# Patient Record
Sex: Male | Born: 1980 | Race: White | Hispanic: No | Marital: Single | Smoking: Current every day smoker
Health system: Southern US, Community
[De-identification: ages and names within clinical notes are randomized; demographics above are authoritative.]

## PROBLEM LIST (undated history)

## (undated) DIAGNOSIS — T7840XA Allergy, unspecified, initial encounter: Secondary | ICD-10-CM

## (undated) HISTORY — DX: Allergy, unspecified, initial encounter: T78.40XA

---

## 2006-08-23 ENCOUNTER — Emergency Department (HOSPITAL_COMMUNITY): Admission: EM | Admit: 2006-08-23 | Discharge: 2006-08-23 | Payer: Self-pay | Admitting: Emergency Medicine

## 2008-02-12 ENCOUNTER — Emergency Department (HOSPITAL_COMMUNITY): Admission: EM | Admit: 2008-02-12 | Discharge: 2008-02-12 | Payer: Self-pay | Admitting: Emergency Medicine

## 2013-11-22 ENCOUNTER — Encounter (HOSPITAL_BASED_OUTPATIENT_CLINIC_OR_DEPARTMENT_OTHER): Payer: Self-pay | Admitting: Emergency Medicine

## 2013-11-22 ENCOUNTER — Emergency Department (HOSPITAL_BASED_OUTPATIENT_CLINIC_OR_DEPARTMENT_OTHER)
Admission: EM | Admit: 2013-11-22 | Discharge: 2013-11-22 | Disposition: A | Discharge status: Home / Self Care | Payer: BC Managed Care – PPO | Attending: Emergency Medicine | Admitting: Emergency Medicine

## 2013-11-22 DIAGNOSIS — A64 Unspecified sexually transmitted disease: Secondary | ICD-10-CM | POA: Insufficient documentation

## 2013-11-22 DIAGNOSIS — F17200 Nicotine dependence, unspecified, uncomplicated: Secondary | ICD-10-CM | POA: Insufficient documentation

## 2013-11-22 LAB — URINALYSIS, ROUTINE W REFLEX MICROSCOPIC
Bilirubin Urine: NEGATIVE
GLUCOSE, UA: NEGATIVE mg/dL
Hgb urine dipstick: NEGATIVE
Ketones, ur: NEGATIVE mg/dL
LEUKOCYTES UA: NEGATIVE
NITRITE: NEGATIVE
PROTEIN: NEGATIVE mg/dL
Specific Gravity, Urine: 1.015 (ref 1.005–1.030)
Urobilinogen, UA: 0.2 mg/dL (ref 0.0–1.0)
pH: 5.5 (ref 5.0–8.0)

## 2013-11-22 MED ORDER — CEFTRIAXONE SODIUM 250 MG IJ SOLR
250.0000 mg | Freq: Once | INTRAMUSCULAR | Status: AC
  Administered 2013-11-22: 250 mg via INTRAMUSCULAR
  Filled 2013-11-22: qty 250

## 2013-11-22 MED ORDER — DOXYCYCLINE HYCLATE 100 MG PO CAPS: 100.0000 mg | ORAL_CAPSULE | Freq: Two times a day (BID) | ORAL | Status: DC

## 2013-11-22 NOTE — ED Notes (Signed)
Dysuria off and on for a week.

## 2013-11-22 NOTE — ED Provider Notes (Signed)
CSN: [id number]     Arrival date & time 11/22/13  1755 History  This chart was scribed for Toy Baker, MD by Blanchard Kelch, ED Scribe. The patient was seen in room MH04/MH04. Patient's care was started at 6:07 PM.    Chief Complaint  Patient presents with  . Dysuria     Patient is a 33 y.o. male presenting with dysuria. The history is provided by the patient. No language interpreter was used.  Dysuria    HPI Comments: Todd Montgomery is a 33 y.o. male who presents to the Emergency Department complaining of constant, moderate dysuria that began a week ago. He reports having a similar symptom in the past that was due to Gonorrhea infection years ago. He denies penile discharge, fever, back pain or genital rash. Symptoms have been persistent and nothing makes them better worse. No treatment used prior to arrival. Last sexual encounter was a week ago  History reviewed. No pertinent past medical history. History reviewed. No pertinent past surgical history. No family history on file. History  Substance Use Topics  . Smoking status: Current Every Day Smoker -- 0.50 packs/day    Types: Cigarettes  . Smokeless tobacco: Not on file  . Alcohol Use: Yes    Review of Systems  Constitutional: Negative for fever.  Genitourinary: Positive for dysuria. Negative for discharge.  Musculoskeletal: Negative for back pain.  Skin: Negative for rash.  All other systems reviewed and are negative.     Allergies  Review of patient's allergies indicates no known allergies.  Home Medications   Prior to Admission medications   Not on File   Triage Vitals: BP 127/84  Pulse 84  Temp(Src) 97.9 F (36.6 C) (Oral)  Resp 16  Ht 5\' 8"  (1.727 m)  Wt 165 lb (74.844 kg)  BMI 25.09 kg/m2  SpO2 100%  Physical Exam  Nursing note and vitals reviewed. Constitutional: He is oriented to person, place, and time. He appears well-developed and well-nourished.  Non-toxic appearance.  HENT:  Head:  Normocephalic and atraumatic.  Eyes: Conjunctivae are normal. Pupils are equal, round, and reactive to light.  Neck: Normal range of motion.  Cardiovascular: Normal rate.   Pulmonary/Chest: Effort normal.  Genitourinary: Testes normal and penis normal. Circumcised.  Neurological: He is alert and oriented to person, place, and time.  Skin: Skin is warm and dry.  Psychiatric: He has a normal mood and affect.    ED Course  Procedures (including critical care time)  DIAGNOSTIC STUDIES: Oxygen Saturation is 100% on room air, normal by my interpretation.    COORDINATION OF CARE: 6:11 PM -Will order GC/Chlamydia probe and UA. Patient verbalizes understanding and agrees with treatment plan.    Labs Review Labs Reviewed  URINALYSIS, ROUTINE W REFLEX MICROSCOPIC    Imaging Review No results found.   EKG Interpretation None      MDM   Final diagnoses:  None    I personally performed the services described in this documentation, which was scribed in my presence. The recorded information has been reviewed and is accurate.  Patient given Rocephin IM and will be placed on doxycycline.   Toy Baker, MD 11/22/13 [phone]

## 2013-11-22 NOTE — Discharge Instructions (Signed)
Sexually Transmitted Disease A sexually transmitted disease (STD) is a disease or infection that may be passed (transmitted) from person to person, usually during sexual activity. This may happen by way of saliva, semen, blood, vaginal mucus, or urine. Common STDs include:   Gonorrhea.   Chlamydia.   Syphilis.   HIV and AIDS.   Genital herpes.   Hepatitis B and C.   Trichomonas.   Human papillomavirus (HPV).   Pubic lice.   Scabies.  Mites.  Bacterial vaginosis. WHAT ARE CAUSES OF STDs? An STD may be caused by bacteria, a virus, or parasites. STDs are often transmitted during sexual activity if one person is infected. However, they may also be transmitted through nonsexual means. STDs may be transmitted after:   Sexual intercourse with an infected person.   Sharing sex toys with an infected person.   Sharing needles with an infected person or using unclean piercing or tattoo needles.  Having intimate contact with the genitals, mouth, or rectal areas of an infected person.   Exposure to infected fluids during birth. WHAT ARE THE SIGNS AND SYMPTOMS OF STDs? Different STDs have different symptoms. Some people may not have any symptoms. If symptoms are present, they may include:   Painful or bloody urination.   Pain in the pelvis, abdomen, vagina, anus, throat, or eyes.   Skin rash, itching, irritation, growths, sores (lesions), ulcerations, or warts in the genital or anal area.  Abnormal vaginal discharge with or without bad odor.   Penile discharge in men.   Fever.   Pain or bleeding during sexual intercourse.   Swollen glands in the groin area.   Yellow skin and eyes (jaundice). This is seen with hepatitis.   Swollen testicles.  Infertility.  Sores and blisters in the mouth. HOW ARE STDs DIAGNOSED? To make a diagnosis, your health care provider may:   Take a medical history.   Perform a physical exam.   Take a sample of any  discharge for examination.  Swab the throat, cervix, opening to the penis, rectum, or vagina for testing.  Test a sample of your first morning urine.   Perform blood tests.   Perform a Pap smear, if this applies.   Perform a colposcopy.   Perform a laparoscopy.  HOW ARE STDs TREATED? Treatment depends on the STD. Some STDs may be treated but not cured.   Chlamydia, gonorrhea, trichomonas, and syphilis can be cured with antibiotics.   Genital herpes, hepatitis, and HIV can be treated, but not cured, with prescribed medicines. The medicines lessen symptoms.   Genital warts from HPV can be treated with medicine or by freezing, burning (electrocautery), or surgery. Warts may come back.   HPV cannot be cured with medicine or surgery. However, abnormal areas may be removed from the cervix, vagina, or vulva.   If your diagnosis is confirmed, your recent sexual partners need treatment. This is true even if they are symptom-free or have a negative culture or evaluation. They should not have sex until their health care providers say it is OK. HOW CAN I REDUCE MY RISK OF GETTING AN STD?  Use latex condoms, dental dams, and water-soluble lubricants during sexual activity. Do not use petroleum jelly or oils.  Get vaccinated for HPV and hepatitis. If you have not received these vaccines in the past, talk to your health care provider about whether one or both might be right for you.   Avoid risky sex practices that can break the skin.  WHAT SHOULD   I DO IF I THINK I HAVE AN STD?  See your health care provider.   Inform all sexual partners. They should be tested and treated for any STDs.  Do not have sex until your health care provider says it is OK. WHEN SHOULD I GET HELP? Seek immediate medical care if:  You develop severe abdominal pain.  You are a man and notice swelling or pain in the testicles.  You are a woman and notice swelling or pain in your vagina. Document  Released: 08/27/2002 Document Revised: 03/27/2013 Document Reviewed: 12/25/2012 [employer] Patient Information 2014 [employer].  

## 2013-11-23 LAB — HIV ANTIBODY (ROUTINE TESTING W REFLEX): HIV: NONREACTIVE

## 2013-11-23 LAB — RPR

## 2013-11-25 LAB — GC/CHLAMYDIA PROBE AMP
CT Probe RNA: NEGATIVE
GC Probe RNA: NEGATIVE

## 2016-02-10 ENCOUNTER — Encounter (HOSPITAL_COMMUNITY): Payer: Self-pay | Admitting: Emergency Medicine

## 2016-02-10 ENCOUNTER — Emergency Department (HOSPITAL_COMMUNITY)
Admission: EM | Admit: 2016-02-10 | Discharge: 2016-02-10 | Disposition: A | Discharge status: Home / Self Care | Payer: Self-pay | Attending: Emergency Medicine | Admitting: Emergency Medicine

## 2016-02-10 DIAGNOSIS — R300 Dysuria: Secondary | ICD-10-CM | POA: Insufficient documentation

## 2016-02-10 DIAGNOSIS — M545 Low back pain: Secondary | ICD-10-CM | POA: Insufficient documentation

## 2016-02-10 DIAGNOSIS — F17210 Nicotine dependence, cigarettes, uncomplicated: Secondary | ICD-10-CM | POA: Insufficient documentation

## 2016-02-10 DIAGNOSIS — R369 Urethral discharge, unspecified: Secondary | ICD-10-CM | POA: Insufficient documentation

## 2016-02-10 LAB — CBC WITH DIFFERENTIAL/PLATELET
Basophils Absolute: 0 10*3/uL (ref 0.0–0.1)
Basophils Relative: 0 %
Eosinophils Absolute: 0.2 10*3/uL (ref 0.0–0.7)
Eosinophils Relative: 3 %
HEMATOCRIT: 47.4 % (ref 39.0–52.0)
Hemoglobin: 16.3 g/dL (ref 13.0–17.0)
LYMPHS ABS: 2.4 10*3/uL (ref 0.7–4.0)
LYMPHS PCT: 43 %
MCH: 32.1 pg (ref 26.0–34.0)
MCHC: 34.4 g/dL (ref 30.0–36.0)
MCV: 93.3 fL (ref 78.0–100.0)
MONO ABS: 0.6 10*3/uL (ref 0.1–1.0)
Monocytes Relative: 11 %
Neutro Abs: 2.4 10*3/uL (ref 1.7–7.7)
Neutrophils Relative %: 43 %
Platelets: 217 10*3/uL (ref 150–400)
RBC: 5.08 MIL/uL (ref 4.22–5.81)
RDW: 11.6 % (ref 11.5–15.5)
WBC: 5.5 10*3/uL (ref 4.0–10.5)

## 2016-02-10 LAB — URINALYSIS, ROUTINE W REFLEX MICROSCOPIC
Bilirubin Urine: NEGATIVE
GLUCOSE, UA: NEGATIVE mg/dL
Hgb urine dipstick: NEGATIVE
Ketones, ur: NEGATIVE mg/dL
Nitrite: NEGATIVE
PROTEIN: NEGATIVE mg/dL
SPECIFIC GRAVITY, URINE: 1.012 (ref 1.005–1.030)
pH: 6 (ref 5.0–8.0)

## 2016-02-10 LAB — COMPREHENSIVE METABOLIC PANEL
ALT: 25 U/L (ref 17–63)
AST: 24 U/L (ref 15–41)
Albumin: 4.3 g/dL (ref 3.5–5.0)
Alkaline Phosphatase: 56 U/L (ref 38–126)
Anion gap: 8 (ref 5–15)
BUN: 12 mg/dL (ref 6–20)
CO2: 28 mmol/L (ref 22–32)
Calcium: 9.5 mg/dL (ref 8.9–10.3)
Chloride: 102 mmol/L (ref 101–111)
Creatinine, Ser: 1.27 mg/dL — ABNORMAL HIGH (ref 0.61–1.24)
GFR calc non Af Amer: >60 mL/min (ref >60)
Glucose, Bld: 79 mg/dL (ref 65–99)
Potassium: 3.7 mmol/L (ref 3.5–5.1)
Sodium: 138 mmol/L (ref 135–145)
Total Bilirubin: 0.9 mg/dL (ref 0.3–1.2)
Total Protein: 7.4 g/dL (ref 6.5–8.1)

## 2016-02-10 LAB — URINE MICROSCOPIC-ADD ON

## 2016-02-10 LAB — POC OCCULT BLOOD, ED: Fecal Occult Bld: NEGATIVE

## 2016-02-10 MED ORDER — NAPROXEN 500 MG PO TABS: 500.0000 mg | ORAL_TABLET | Freq: Two times a day (BID) | ORAL | 0 refills | Status: DC

## 2016-02-10 MED ORDER — CEFTRIAXONE SODIUM 250 MG IJ SOLR
250.0000 mg | Freq: Once | INTRAMUSCULAR | Status: AC
  Administered 2016-02-10: 250 mg via INTRAMUSCULAR
  Filled 2016-02-10: qty 250

## 2016-02-10 MED ORDER — LIDOCAINE 5 % EX PTCH: 1.0000 | MEDICATED_PATCH | CUTANEOUS | 0 refills | Status: DC

## 2016-02-10 MED ORDER — AZITHROMYCIN 250 MG PO TABS
1000.0000 mg | ORAL_TABLET | Freq: Every day | ORAL | Status: DC
Start: 2016-02-11 — End: 2016-02-11
  Administered 2016-02-10: 1000 mg via ORAL
  Filled 2016-02-10: qty 4

## 2016-02-10 MED ORDER — LIDOCAINE HCL (PF) 1 % IJ SOLN
2.0000 mL | Freq: Once | INTRAMUSCULAR | Status: AC
  Administered 2016-02-10: 2 mL via INTRADERMAL
  Filled 2016-02-10: qty 5

## 2016-02-10 NOTE — ED Provider Notes (Signed)
[facility]-EMERGENCY DEPT Provider Note   CSN: [id number] Arrival date & time: 02/10/16  2044  By signing my name below, I, Todd Montgomery, attest that this documentation has been prepared under the direction and in the presence of Cheri FowlerKayla Zeynab Klett, PA-C. Electronically Signed: Gillis EndsJasmyn B. Lyn Montgomery, ED Scribe. 02/10/16. 9:12 PM.  History   Chief Complaint Chief Complaint  Patient presents with  . Back Pain    HPI HPI Comments: Todd Montgomery is a 35 y.o. male who presents to the Emergency Department complaining of sudden onset, intermittent, sharp, radiating left-side lower back pain to left buttock x 2 weeks. He states that pain suddenly began after standing up from feeding his dogs. Pt has associated nausea, dysuria, and 2 episodes of "burgundy" BMs.  Last normal BM, Monday. He has taken Ibuprofen for pain ([phone] mg) with mild relief. Pain is exacerbated with movement. Denies any recent diet changes. Denies any weakness, numbness, bowel/urinary incontinence, abdominal pain, fever, chills, or vomiting.  Patient told male EMT that he was having penile discharge/burning. Endorses new sexual partners.   The history is provided by the patient. No language interpreter was used.    History reviewed. No pertinent past medical history.  There are no active problems to display for this patient.  History reviewed. No pertinent surgical history.  Home Medications    Prior to Admission medications   Medication Sig Start Date End Date Taking? Authorizing Provider  doxycycline (VIBRAMYCIN) 100 MG capsule Take 1 capsule (100 mg total) by mouth 2 (two) times daily. 11/22/13   Lorre NickAnthony Allen, MD  lidocaine (LIDODERM) 5 % Place 1 patch onto the skin daily. Remove & Discard patch within 12 hours or as directed by MD 02/10/16   Cheri FowlerKayla Taytum Scheck, PA-C  naproxen (NAPROSYN) 500 MG tablet Take 1 tablet (500 mg total) by mouth 2 (two) times daily. 02/10/16   Cheri FowlerKayla Ashima Shrake, PA-C    Family History No family history on  file.  Social History Social History  Substance Use Topics  . Smoking status: Current Every Day Smoker    Packs/day: 0.50    Types: Cigarettes  . Smokeless tobacco: Not on file  . Alcohol use Yes     Allergies   Review of patient's allergies indicates no known allergies.   Review of Systems Review of Systems  Constitutional: Negative for chills and fever.  Gastrointestinal: Positive for blood in stool and nausea. Negative for abdominal pain and vomiting.  Genitourinary: Positive for dysuria.  Musculoskeletal: Positive for back pain.  Neurological: Negative for weakness and numbness.  All other systems reviewed and are negative.  Physical Exam Updated Vital Signs BP 129/96 (BP Location: Left Arm)   Pulse 88   Temp 98.2 F (36.8 C) (Oral)   Resp 19   Ht 5\' 8"  (1.727 m)   Wt 74.8 kg   SpO2 99%   BMI 25.09 kg/m   Physical Exam  Constitutional: He is oriented to person, place, and time. He appears well-developed and well-nourished.  HENT:  Head: Atraumatic.  Eyes: Conjunctivae are normal.  Cardiovascular: Normal rate, regular rhythm, normal heart sounds and intact distal pulses.   Pulses:      Dorsalis pedis pulses are 2+ on the right side, and 2+ on the left side.  Pulmonary/Chest: Effort normal and breath sounds normal.  Abdominal: Soft. Bowel sounds are normal. He exhibits no distension. There is no tenderness.  Genitourinary: Rectum normal, prostate normal, testes normal and penis normal. Rectal exam shows no external hemorrhoid, no internal  hemorrhoid and no tenderness. Right testis shows no swelling and no tenderness. Left testis shows no swelling and no tenderness. Circumcised.  Genitourinary Comments: Chaperone present during physical exam.  No gross blood on DRE.  Musculoskeletal: Normal range of motion. He exhibits no tenderness.  No spinous process tenderness.  No step offs. No crepitus.  Neurological: He is alert and oriented to person, place, and time.    No saddle anesthesia. Strength and sensation intact bilaterally throughout lower extremities. Normal gait.   Skin: Skin is warm and dry.  Psychiatric: He has a normal mood and affect. His behavior is normal.   ED Treatments / Results  DIAGNOSTIC STUDIES: Oxygen Saturation is 99% on RA, normal by my interpretation.    COORDINATION OF CARE: 9:10 PM-Discussed treatment plan which includes rectal exam, UA, CBC, and BMP with pt at bedside and pt agreed to plan.   Labs (all labs ordered are listed, but only abnormal results are displayed) Labs Reviewed  URINALYSIS, ROUTINE W REFLEX MICROSCOPIC (NOT AT [facility]) - Abnormal; Notable for the following:       Result Value   Leukocytes, UA SMALL (*)    All other components within normal limits  COMPREHENSIVE METABOLIC PANEL - Abnormal; Notable for the following:    Creatinine, Ser 1.27 (*)    All other components within normal limits  URINE MICROSCOPIC-ADD ON - Abnormal; Notable for the following:    Squamous Epithelial / LPF 0-5 (*)    Bacteria, UA RARE (*)    All other components within normal limits  CBC WITH DIFFERENTIAL/PLATELET  HIV ANTIBODY (ROUTINE TESTING)  RPR  POC OCCULT BLOOD, ED  GC/CHLAMYDIA PROBE AMP ([place]) NOT AT [facility]    Radiology No results found.  Procedures Procedures (including critical care time)  Medications Ordered in ED Medications  azithromycin (ZITHROMAX) tablet 1,000 mg (1,000 mg Oral Given 02/10/16 2320)  cefTRIAXone (ROCEPHIN) injection 250 mg (250 mg Intramuscular Given 02/10/16 2319)  lidocaine (PF) (XYLOCAINE) 1 % injection 2 mL (2 mLs Intradermal Given 02/10/16 2319)    Initial Impression / Assessment and Plan / ED Course  I have reviewed the triage vital signs and the nursing notes.  Pertinent labs & imaging results that were available during my care of the patient were reviewed by me and considered in my medical decision making (see chart for details).  Clinical Course   Patient presents  with low back pain.  VSS.  No injury/trauma.  No red flags.  No neurological deficits.  Distal pulses intact.  No gait abnormalities.  Suspect low back strain or other mechanical cause.  Doubt cauda equina.  Doubt infectious process.  Doubt AAA.  Patient also complained of 2 episodes of bloody stools, occult negative.  No gross blood on DRE.  Hgb stable.  Labs without acute abnormalities. Also, complaining of dysuria, penile discharge/burning.  GC/chlamydia, HIV, RPR pending. Treated empirically with Rocephin and Azithromycin.  Discussed return precautions to the ED.  Follow up with PCP.   Final Clinical Impressions(s) / ED Diagnoses   Final diagnoses:  Low back pain without sciatica, unspecified back pain laterality  Penile discharge  Dysuria    New Prescriptions New Prescriptions   LIDOCAINE (LIDODERM) 5 %    Place 1 patch onto the skin daily. Remove & Discard patch within 12 hours or as directed by MD   NAPROXEN (NAPROSYN) 500 MG TABLET    Take 1 tablet (500 mg total) by mouth 2 (two) times daily.   I personally  performed the services described in this documentation, which was scribed in my presence. The recorded information has been reviewed and is accurate.    Cheri FowlerKayla Marny Smethers, PA-C 02/10/16 2334    Charlynne Panderavid Hsienta Yao, MD 02/10/16 [phone]

## 2016-02-10 NOTE — ED Triage Notes (Signed)
Pt. reports left lower back pain for 1 1/2 weeks , pt. stated he stood up while feeding the dogs and felt sudden pain at left lower back , pain increases when turning to sides and getting up , denies urinary discomfort , ambulatory .

## 2016-02-10 NOTE — Discharge Instructions (Signed)
Your test results will be available in the next couple of days. If they are positive, you will receive a phone call. You have already been treated for STDs, so you do not need to return for treatment. Please inform all partners. Do not have sex for the next 7 days, until your symptoms resolve, and until all partners have been treated and tested.

## 2016-02-11 LAB — RPR: RPR: NONREACTIVE

## 2016-02-11 LAB — HIV ANTIBODY (ROUTINE TESTING W REFLEX): HIV Screen 4th Generation wRfx: NONREACTIVE

## 2016-02-11 LAB — GC/CHLAMYDIA PROBE AMP (~~LOC~~) NOT AT ARMC
Chlamydia: POSITIVE — AB
Neisseria Gonorrhea: NEGATIVE

## 2016-02-12 ENCOUNTER — Telehealth (HOSPITAL_BASED_OUTPATIENT_CLINIC_OR_DEPARTMENT_OTHER): Payer: Self-pay | Admitting: Emergency Medicine

## 2017-05-11 ENCOUNTER — Encounter (HOSPITAL_COMMUNITY): Payer: Self-pay | Admitting: Emergency Medicine

## 2017-05-11 ENCOUNTER — Emergency Department (HOSPITAL_COMMUNITY)
Admission: EM | Admit: 2017-05-11 | Discharge: 2017-05-11 | Disposition: A | Discharge status: Home / Self Care | Payer: Self-pay | Attending: Emergency Medicine | Admitting: Emergency Medicine

## 2017-05-11 DIAGNOSIS — N410 Acute prostatitis: Secondary | ICD-10-CM | POA: Insufficient documentation

## 2017-05-11 DIAGNOSIS — R1033 Periumbilical pain: Secondary | ICD-10-CM | POA: Insufficient documentation

## 2017-05-11 DIAGNOSIS — F17210 Nicotine dependence, cigarettes, uncomplicated: Secondary | ICD-10-CM | POA: Insufficient documentation

## 2017-05-11 LAB — URINALYSIS, ROUTINE W REFLEX MICROSCOPIC
Bilirubin Urine: NEGATIVE
Glucose, UA: NEGATIVE mg/dL
KETONES UR: NEGATIVE mg/dL
Nitrite: NEGATIVE
Protein, ur: 30 mg/dL — AB
Specific Gravity, Urine: 1.025 (ref 1.005–1.030)
Squamous Epithelial / LPF: NONE SEEN
pH: 5 (ref 5.0–8.0)

## 2017-05-11 LAB — CBC WITH DIFFERENTIAL/PLATELET
BASOS PCT: 0 %
Basophils Absolute: 0 10*3/uL (ref 0.0–0.1)
EOS PCT: 2 %
Eosinophils Absolute: 0.1 10*3/uL (ref 0.0–0.7)
HCT: 45 % (ref 39.0–52.0)
Hemoglobin: 16 g/dL (ref 13.0–17.0)
LYMPHS PCT: 26 %
Lymphs Abs: 1.6 10*3/uL (ref 0.7–4.0)
MCH: 32.9 pg (ref 26.0–34.0)
MCHC: 35.6 g/dL (ref 30.0–36.0)
MCV: 92.4 fL (ref 78.0–100.0)
Monocytes Absolute: 0.6 10*3/uL (ref 0.1–1.0)
Monocytes Relative: 10 %
Neutro Abs: 3.9 10*3/uL (ref 1.7–7.7)
Neutrophils Relative %: 62 %
Platelets: 206 10*3/uL (ref 150–400)
RBC: 4.87 MIL/uL (ref 4.22–5.81)
RDW: 11.8 % (ref 11.5–15.5)
WBC: 6.2 10*3/uL (ref 4.0–10.5)

## 2017-05-11 LAB — BASIC METABOLIC PANEL
ANION GAP: 8 (ref 5–15)
BUN: 11 mg/dL (ref 6–20)
CALCIUM: 9.4 mg/dL (ref 8.9–10.3)
CO2: 28 mmol/L (ref 22–32)
Chloride: 103 mmol/L (ref 101–111)
Creatinine, Ser: 1.13 mg/dL (ref 0.61–1.24)
GLUCOSE: 99 mg/dL (ref 65–99)
POTASSIUM: 3.9 mmol/L (ref 3.5–5.1)
Sodium: 139 mmol/L (ref 135–145)

## 2017-05-11 MED ORDER — LIDOCAINE HCL (PF) 1 % IJ SOLN
INTRAMUSCULAR | Status: AC
  Administered 2017-05-11: 5 mL
  Filled 2017-05-11: qty 5

## 2017-05-11 MED ORDER — DOXYCYCLINE HYCLATE 100 MG PO CAPS: 100.0000 mg | ORAL_CAPSULE | Freq: Two times a day (BID) | ORAL | 0 refills | Status: AC

## 2017-05-11 MED ORDER — CEFTRIAXONE SODIUM 250 MG IJ SOLR
250.0000 mg | Freq: Once | INTRAMUSCULAR | Status: AC
  Administered 2017-05-11: 250 mg via INTRAMUSCULAR
  Filled 2017-05-11: qty 250

## 2017-05-11 NOTE — Discharge Instructions (Signed)
Take antibiotics as directed. Please take all of your antibiotics until finished.  You can take Tylenol or Ibuprofen as directed for pain. You can alternate Tylenol and Ibuprofen every 4 hours. If you take Tylenol at 1pm, then you can take Ibuprofen at 5pm. Then you can take Tylenol again at 9pm.   Follow-up with your primary care doctor in the next 24-40 hours for further evaluation.  If you do not have a primary care doctor, he can use the clinic listed in the paperwork.  Do not have any sexual intercourse until you have completed your treatment.  Return to emergency department for any fevers, worsening pain or difficulty urinating, abdominal pain, nausea or vomiting or any other worsening or concerning symptoms.

## 2017-05-11 NOTE — ED Provider Notes (Signed)
[facility]-EMERGENCY DEPT Provider Note   CSN: [id number] Arrival date & time: 05/11/17  0930     History   Chief Complaint Chief Complaint  Patient presents with  . Dysuria    HPI Todd Montgomery is a 36 y.o. male resents with dysuria and urinary hesitation for the last 2 days.  Patient reports that initially, when symptoms began he was hesitant to urinate because it was painful.  He reports that he still has been able to urinate and that difficulty has improved. He has not noted any hematuria. Patient also reporting suprapubic abdominal pain and perineal pain. He states that it is uncomfortable to sit down. He is currently sexually active with one sexual partner.  They do not use any protection. He denies any fevers, nausea/vomiting, testicular pain or swelling, penile discharge, penile pain.  The history is provided by the patient.    History reviewed. No pertinent past medical history.  There are no active problems to display for this patient.   History reviewed. No pertinent surgical history.     Home Medications    Prior to Admission medications   Medication Sig Start Date End Date Taking? Authorizing Provider  doxycycline (VIBRAMYCIN) 100 MG capsule Take 1 capsule (100 mg total) by mouth 2 (two) times daily for 14 days. 05/11/17 05/25/17  Graciella FreerLayden, Norvell Ureste A, PA-C  lidocaine (LIDODERM) 5 % Place 1 patch onto the skin daily. Remove & Discard patch within 12 hours or as directed by MD Patient not taking: Reported on 05/11/2017 02/10/16   Cheri Fowlerose, Kayla, PA-C  naproxen (NAPROSYN) 500 MG tablet Take 1 tablet (500 mg total) by mouth 2 (two) times daily. Patient not taking: Reported on 05/11/2017 02/10/16   Cheri Fowlerose, Kayla, PA-C    Family History History reviewed. No pertinent family history.  Social History Social History   Tobacco Use  . Smoking status: Current Every Day Smoker    Packs/day: 0.50    Types: Cigarettes  Substance Use Topics  . Alcohol  use: Yes  . Drug use: No     Allergies   Patient has no known allergies.   Review of Systems Review of Systems  Constitutional: Negative for fever.  Gastrointestinal: Positive for abdominal pain.  Genitourinary: Positive for difficulty urinating and dysuria. Negative for discharge, hematuria, penile pain, penile swelling, scrotal swelling and testicular pain.       Perineal pain     Physical Exam Updated Vital Signs BP (!) 125/92 (BP Location: Left Arm)   Pulse 75   Temp 98 F (36.7 C) (Oral)   Resp 18   SpO2 97%   Physical Exam  Constitutional: He is oriented to person, place, and time. He appears well-developed and well-nourished.  Sitting comfortably on examination table  HENT:  Head: Normocephalic and atraumatic.  Mouth/Throat: Oropharynx is clear and moist and mucous membranes are normal.  Eyes: Conjunctivae, EOM and lids are normal. Pupils are equal, round, and reactive to light.  Neck: Full passive range of motion without pain.  Cardiovascular: Normal rate, regular rhythm, normal heart sounds and normal pulses. Exam reveals no gallop and no friction rub.  No murmur heard. Pulmonary/Chest: Effort normal and breath sounds normal.  Abdominal: Soft. Normal appearance. There is tenderness in the suprapubic area. There is no rigidity, no guarding and no CVA tenderness. Hernia confirmed negative in the right inguinal area.  Mild suprapubic tenderness palpation.  No rigidity, guarding.  No peritoneal signs.  No CVA tenderness bilaterally.  No tenderness to  palpation to McBurney's point.  Genitourinary: Testes normal and penis normal. Rectal exam shows no mass. Prostate is enlarged and tender. Right testis shows no swelling and no tenderness. Left testis shows no swelling and no tenderness. Circumcised.  Genitourinary Comments: The exam was performed with a chaperone present.  Rectal exam showed tender prostate.  Patient has tenderness perineal region.  No evidence of warmth,  erythema, fluctuance noted to the perianal or perirectal area.  No evidence of abscess noted.  Musculoskeletal: Normal range of motion.  Neurological: He is alert and oriented to person, place, and time.  Skin: Skin is warm and dry. Capillary refill takes less than 2 seconds.  Psychiatric: He has a normal mood and affect. His speech is normal.  Nursing note and vitals reviewed.    ED Treatments / Results  Labs (all labs ordered are listed, but only abnormal results are displayed) Labs Reviewed  URINALYSIS, ROUTINE W REFLEX MICROSCOPIC - Abnormal; Notable for the following components:      Result Value   Color, Urine AMBER (*)    Hgb urine dipstick SMALL (*)    Protein, ur 30 (*)    Leukocytes, UA MODERATE (*)    Bacteria, UA RARE (*)    All other components within normal limits  CBC WITH DIFFERENTIAL/PLATELET  BASIC METABOLIC PANEL    EKG  EKG Interpretation None       Radiology No results found.  Procedures Procedures (including critical care time)  Medications Ordered in ED Medications  cefTRIAXone (ROCEPHIN) injection 250 mg (250 mg Intramuscular Given 05/11/17 1129)  lidocaine (PF) (XYLOCAINE) 1 % injection (5 mLs  Given 05/11/17 1129)     Initial Impression / Assessment and Plan / ED Course  I have reviewed the triage vital signs and the nursing notes.  Pertinent labs & imaging results that were available during my care of the patient were reviewed by me and considered in my medical decision making (see chart for details).     36 year old male who presents with 2 days of dysuria and urinary hesitancy.  No fevers, penile discharge, testicular pain or swelling.  Currently sexually active with male partner.  They do not use protection.  History of STDs a few years ago but treated.  Patient is afebrile, non-toxic appearing.  Appears uncomfortable sitting down. Vital signs reviewed and stable.  Physical exam is concerning for prostatitis.  Also consider STD.   History/physical exam not concerning for testicular torsion, epididymitis, perirectal/perianal abscess. Do not suspect kidney stone based on history/physical exam.  Urine collected at triage.  Gonorrhea chlamydia sent.  Will add additional labs.  Urine reviewed.  Patient was small amount of hemoglobin, leukocytes, pyuria.  CBC unremarkable.  BMP unremarkable.  Will plan to treat as prostatitis.  Patient reports no known allergies to medications.  Discussed plan with patient.  Instructed no sexual intercourse until he completes treatment.  Provided patient with a list of clinic resources to use if he does not have a PCP. Instructed to call them today to arrange follow-up in the next 24-48 hours. Patient had ample opportunity for questions and discussion. All patient's questions were answered with full understanding. Strict return precautions discussed. Patient expresses understanding and agreement to plan.    Final Clinical Impressions(s) / ED Diagnoses   Final diagnoses:  Acute prostatitis    ED Discharge Orders        Ordered    doxycycline (VIBRAMYCIN) 100 MG capsule  2 times daily     05/11/17  [address]       Rosana Hoes 05/11/17 1550    Gwyneth Sprout, MD 05/12/17 2205

## 2017-05-11 NOTE — ED Notes (Signed)
Pt. States he had trouble urinating Tuesday morning around 4:00 am.

## 2017-05-11 NOTE — ED Triage Notes (Signed)
Pt reports he has had difficulty urinating for the past 2 days. Pt having suprapubic pain and only able to urinate a small amount at a time.

## 2019-07-02 ENCOUNTER — Ambulatory Visit (INDEPENDENT_AMBULATORY_CARE_PROVIDER_SITE_OTHER): Payer: Self-pay | Admitting: Nurse Practitioner

## 2019-07-02 ENCOUNTER — Other Ambulatory Visit: Payer: Self-pay

## 2019-07-02 ENCOUNTER — Encounter: Payer: Self-pay | Admitting: Nurse Practitioner

## 2019-07-02 VITALS — BP 132/90 | HR 75 | Temp 98.4°F | Ht 68.8 in | Wt 176.8 lb

## 2019-07-02 DIAGNOSIS — R350 Frequency of micturition: Secondary | ICD-10-CM

## 2019-07-02 DIAGNOSIS — M545 Low back pain: Secondary | ICD-10-CM

## 2019-07-02 DIAGNOSIS — G8929 Other chronic pain: Secondary | ICD-10-CM

## 2019-07-02 DIAGNOSIS — R7309 Other abnormal glucose: Secondary | ICD-10-CM

## 2019-07-02 DIAGNOSIS — R030 Elevated blood-pressure reading, without diagnosis of hypertension: Secondary | ICD-10-CM

## 2019-07-02 LAB — POCT URINALYSIS DIPSTICK
Blood, UA: NEGATIVE
Glucose, UA: NEGATIVE
Ketones, UA: NEGATIVE
Nitrite, UA: NEGATIVE
Protein, UA: POSITIVE — AB
Spec Grav, UA: 1.025 (ref 1.010–1.025)
Urobilinogen, UA: 0.2 E.U./dL
pH, UA: 5.5 (ref 5.0–8.0)

## 2019-07-02 LAB — POCT UA - MICROALBUMIN
Albumin/Creatinine Ratio, Urine, POC: 300
Creatinine, POC: 300 mg/dL
Microalbumin Ur, POC: 80 mg/L

## 2019-07-02 MED ORDER — AMLODIPINE BESYLATE 2.5 MG PO TABS: 2.5000 mg | ORAL_TABLET | Freq: Every day | ORAL | 1 refills | Status: DC

## 2019-07-02 MED FILL — AMLODIPINE 2.5 MG TABLET: 2.5 | 30 days supply | Qty: 30 | Fill #0

## 2019-07-02 NOTE — Patient Instructions (Signed)
Managing Your Hypertension Hypertension is commonly called high blood pressure. This is when the force of your blood pressing against the walls of your arteries is too strong. Arteries are blood vessels that carry blood from your heart throughout your body. Hypertension forces the heart to work harder to pump blood, and may cause the arteries to become narrow or stiff. Having untreated or uncontrolled hypertension can cause heart attack, stroke, kidney disease, and other problems. What are blood pressure readings? A blood pressure reading consists of a higher number over a lower number. Ideally, your blood pressure should be below 120/80. The first ("top") number is called the systolic pressure. It is a measure of the pressure in your arteries as your heart beats. The second ("bottom") number is called the diastolic pressure. It is a measure of the pressure in your arteries as the heart relaxes. What does my blood pressure reading mean? Blood pressure is classified into four stages. Based on your blood pressure reading, your health care provider may use the following stages to determine what type of treatment you need, if any. Systolic pressure and diastolic pressure are measured in a unit called mm Hg. Normal  Systolic pressure: below 120.  Diastolic pressure: below 80. Elevated  Systolic pressure: 120-129.  Diastolic pressure: below 80. Hypertension stage 1  Systolic pressure: 130-139.  Diastolic pressure: 80-89. Hypertension stage 2  Systolic pressure: 140 or above.  Diastolic pressure: 90 or above. What health risks are associated with hypertension? Managing your hypertension is an important responsibility. Uncontrolled hypertension can lead to:  A heart attack.  A stroke.  A weakened blood vessel (aneurysm).  Heart failure.  Kidney damage.  Eye damage.  Metabolic syndrome.  Memory and concentration problems. What changes can I make to manage my  hypertension? Hypertension can be managed by making lifestyle changes and possibly by taking medicines. Your health care provider will help you make a plan to bring your blood pressure within a normal range. Eating and drinking   Eat a diet that is high in fiber and potassium, and low in salt (sodium), added sugar, and fat. An example eating plan is called the DASH (Dietary Approaches to Stop Hypertension) diet. To eat this way: ? Eat plenty of fresh fruits and vegetables. Try to fill half of your plate at each meal with fruits and vegetables. ? Eat whole grains, such as whole wheat pasta, brown rice, or whole grain bread. Fill about one quarter of your plate with whole grains. ? Eat low-fat diary products. ? Avoid fatty cuts of meat, processed or cured meats, and poultry with skin. Fill about one quarter of your plate with lean proteins such as fish, chicken without skin, beans, eggs, and tofu. ? Avoid premade and processed foods. These tend to be higher in sodium, added sugar, and fat.  Reduce your daily sodium intake. Most people with hypertension should eat less than 1,500 mg of sodium a day.  Limit alcohol intake to no more than 1 drink a day for nonpregnant women and 2 drinks a day for men. One drink equals 12 oz of beer, 5 oz of wine, or 1 oz of hard liquor. Lifestyle  Work with your health care provider to maintain a healthy body weight, or to lose weight. Ask what an ideal weight is for you.  Get at least 30 minutes of exercise that causes your heart to beat faster (aerobic exercise) most days of the week. Activities may include walking, swimming, or biking.  Include exercise   to strengthen your muscles (resistance exercise), such as weight lifting, as part of your weekly exercise routine. Try to do these types of exercises for 30 minutes at least 3 days a week.  Do not use any products that contain nicotine or tobacco, such as cigarettes and e-cigarettes. If you need help quitting,  ask your health care provider.  Control any long-term (chronic) conditions you have, such as high cholesterol or diabetes. Monitoring  Monitor your blood pressure at home as told by your health care provider. Your personal target blood pressure may vary depending on your medical conditions, your age, and other factors.  Have your blood pressure checked regularly, as often as told by your health care provider. Working with your health care provider  Review all the medicines you take with your health care provider because there may be side effects or interactions.  Talk with your health care provider about your diet, exercise habits, and other lifestyle factors that may be contributing to hypertension.  Visit your health care provider regularly. Your health care provider can help you create and adjust your plan for managing hypertension. Will I need medicine to control my blood pressure? Your health care provider may prescribe medicine if lifestyle changes are not enough to get your blood pressure under control, and if:  Your systolic blood pressure is 130 or higher.  Your diastolic blood pressure is 80 or higher. Take medicines only as told by your health care provider. Follow the directions carefully. Blood pressure medicines must be taken as prescribed. The medicine does not work as well when you skip doses. Skipping doses also puts you at risk for problems. Contact a health care provider if:  You think you are having a reaction to medicines you have taken.  You have repeated (recurrent) headaches.  You feel dizzy.  You have swelling in your ankles.  You have trouble with your vision. Get help right away if:  You develop a severe headache or confusion.  You have unusual weakness or numbness, or you feel faint.  You have severe pain in your chest or abdomen.  You vomit repeatedly.  You have trouble breathing. Summary  Hypertension is when the force of blood pumping  through your arteries is too strong. If this condition is not controlled, it may put you at risk for serious complications.  Your personal target blood pressure may vary depending on your medical conditions, your age, and other factors. For most people, a normal blood pressure is less than 120/80.  Hypertension is managed by lifestyle changes, medicines, or both. Lifestyle changes include weight loss, eating a healthy, low-sodium diet, exercising more, and limiting alcohol. This information is not intended to replace advice given to you by your health care provider. Make sure you discuss any questions you have with your health care provider. Document Revised: 09/28/2018 Document Reviewed: 05/04/2016 Elsevier Patient Education  2020 Else[employer] Inc.     Hypertension, Adult Hypertension is another name for high blood pressure. High blood pressure forces your heart to work harder to pump blood. This can cause problems over time. There are two numbers in a blood pressure reading. There is a top number (systolic) over a bottom number (diastolic). It is best to have a blood pressure that is below 120/80. Healthy choices can help lower your blood pressure, or you may need medicine to help lower it. What are the causes? The cause of this condition is not known. Some conditions may be related to high blood pressure. What   increases the risk?  Smoking.  Having type 2 diabetes mellitus, high cholesterol, or both.  Not getting enough exercise or physical activity.  Being overweight.  Having too much fat, sugar, calories, or salt (sodium) in your diet.  Drinking too much alcohol.  Having long-term (chronic) kidney disease.  Having a family history of high blood pressure.  Age. Risk increases with age.  Race. You may be at higher risk if you are African American.  Gender. Men are at higher risk than women before age 45. After age 65, women are at higher risk than men.  Having obstructive  sleep apnea.  Stress. What are the signs or symptoms?  High blood pressure may not cause symptoms. Very high blood pressure (hypertensive crisis) may cause: ? Headache. ? Feelings of worry or nervousness (anxiety). ? Shortness of breath. ? Nosebleed. ? A feeling of being sick to your stomach (nausea). ? Throwing up (vomiting). ? Changes in how you see. ? Very bad chest pain. ? Seizures. How is this treated?  This condition is treated by making healthy lifestyle changes, such as: ? Eating healthy foods. ? Exercising more. ? Drinking less alcohol.  Your health care provider may prescribe medicine if lifestyle changes are not enough to get your blood pressure under control, and if: ? Your top number is above 130. ? Your bottom number is above 80.  Your personal target blood pressure may vary. Follow these instructions at home: Eating and drinking   If told, follow the DASH eating plan. To follow this plan: ? Fill one half of your plate at each meal with fruits and vegetables. ? Fill one fourth of your plate at each meal with whole grains. Whole grains include whole-wheat pasta, brown rice, and whole-grain bread. ? Eat or drink low-fat dairy products, such as skim milk or low-fat yogurt. ? Fill one fourth of your plate at each meal with low-fat (lean) proteins. Low-fat proteins include fish, chicken without skin, eggs, beans, and tofu. ? Avoid fatty meat, cured and processed meat, or chicken with skin. ? Avoid pre-made or processed food.  Eat less than 1,500 mg of salt each day.  Do not drink alcohol if: ? Your doctor tells you not to drink. ? You are pregnant, may be pregnant, or are planning to become pregnant.  If you drink alcohol: ? Limit how much you use to:  0-1 drink a day for women.  0-2 drinks a day for men. ? Be aware of how much alcohol is in your drink. In the [place] one drink equals one 12 oz bottle of beer (355 mL), one 5 oz glass of wine (148 mL), or one  1 oz glass of hard liquor (44 mL). Lifestyle   Work with your doctor to stay at a healthy weight or to lose weight. Ask your doctor what the best weight is for you.  Get at least 30 minutes of exercise most days of the week. This may include walking, swimming, or biking.  Get at least 30 minutes of exercise that strengthens your muscles (resistance exercise) at least 3 days a week. This may include lifting weights or doing Pilates.  Do not use any products that contain nicotine or tobacco, such as cigarettes, e-cigarettes, and chewing tobacco. If you need help quitting, ask your doctor.  Check your blood pressure at home as told by your doctor.  Keep all follow-up visits as told by your doctor. This is important. Medicines  Take over-the-counter and prescription medicines only   as told by your doctor. Follow directions carefully.  Do not skip doses of blood pressure medicine. The medicine does not work as well if you skip doses. Skipping doses also puts you at risk for problems.  Ask your doctor about side effects or reactions to medicines that you should watch for. Contact a doctor if you:  Think you are having a reaction to the medicine you are taking.  Have headaches that keep coming back (recurring).  Feel dizzy.  Have swelling in your ankles.  Have trouble with your vision. Get help right away if you:  Get a very bad headache.  Start to feel mixed up (confused).  Feel weak or numb.  Feel faint.  Have very bad pain in your: ? Chest. ? Belly (abdomen).  Throw up more than once.  Have trouble breathing. Summary  Hypertension is another name for high blood pressure.  High blood pressure forces your heart to work harder to pump blood.  For most people, a normal blood pressure is less than 120/80.  Making healthy choices can help lower blood pressure. If your blood pressure does not get lower with healthy choices, you may need to take medicine. This  information is not intended to replace advice given to you by your health care provider. Make sure you discuss any questions you have with your health care provider. Document Revised: 02/14/2018 Document Reviewed: 02/14/2018 Elsevier Patient Education  2020 Elsevier Inc.  

## 2019-07-02 NOTE — Progress Notes (Signed)
This visit occurred during the SARS-CoV-2 public health emergency.  Safety protocols were in place, including screening questions prior to the visit, additional usage of staff PPE, and extensive cleaning of exam room while observing appropriate contact time as indicated for disinfecting solutions.  Subjective:     Patient ID: Todd Montgomery , male    DOB: [birth year] , 39 y.o.   MRN: [id number]   Chief Complaint  Patient presents with  . Establish Care  . elevated bp    patient stated he noticed his bp was elevated on sunday 154/111, 150/112 and it was checked yesterday 150/129 and last night 150/120. patient stated he has not had any headaches or dizzines  patient stated he was seeing spots   . Urinary Frequency    HPI  Here to establish care - he has not had a PCP.  He was referred by a friend.  Works at [employer].  Single.  4 children and one on the way.  Children are healthy. Smokes cigarettes 4-5 per day for approximately 20 years.  Alcohol - 3-4 shots and 3 beers daily.    PMH - no problems.  [facility] - Mother - HTN, Stroke and Maternal grandmother HTN.   Urinary Frequency  This is a chronic problem. The current episode started more than 1 year ago. The problem occurs intermittently. The problem has been unchanged. There has been no fever. He is sexually active. There is no history of pyelonephritis. Associated symptoms include frequency and hesitancy. Pertinent negatives include no chills, flank pain, hematuria, nausea or urgency. There is no history of catheterization, kidney stones or recurrent UTIs.  Hypertension This is a new problem. The current episode started in the past 7 days. The problem is uncontrolled. Pertinent negatives include no anxiety, blurred vision, chest pain, headaches (2-3 weeks ago), palpitations, peripheral edema or shortness of breath. (Seeing "dots" out of his left eye.  ) There are no associated agents to hypertension. Risk factors for coronary artery  disease include sedentary lifestyle, smoking/tobacco exposure and family history. Compliance problems include exercise.  There is no history of kidney disease. There is no history of chronic renal disease.     Past Medical History:  Diagnosis Date  . Allergy      Family History  Problem Relation Age of Onset  . Hypertension Mother   . Stroke Mother   . Healthy Sister   . Hypertension Maternal Grandmother     No current outpatient medications on file.   No Known Allergies   Review of Systems  Constitutional: Negative for chills.  Eyes: Negative for blurred vision.  Respiratory: Negative for shortness of breath.   Cardiovascular: Negative for chest pain, palpitations and leg swelling.  Gastrointestinal: Negative for nausea.  Endocrine: Positive for polyuria. Negative for polydipsia and polyphagia.  Genitourinary: Positive for frequency and hesitancy. Negative for flank pain, hematuria and urgency.  Neurological: Negative for dizziness and headaches (2-3 weeks ago).  Psychiatric/Behavioral: Negative.      Today's Vitals   07/02/19 0959  BP: 132/90  Pulse: 75  Temp: 98.4 F (36.9 C)  TempSrc: Oral  SpO2: 97%  Weight: 176 lb 12.8 oz (80.2 kg)  Height: 5' 8.8" (1.748 m)  PainSc: 0-No pain   Body mass index is 26.26 kg/m.   Objective:  Physical Exam Constitutional:      General: He is not in acute distress.    Appearance: Normal appearance.  Cardiovascular:     Rate and Rhythm: Normal rate  and regular rhythm.     Pulses: Normal pulses.     Heart sounds: Normal heart sounds. No murmur.  Pulmonary:     Effort: Pulmonary effort is normal. No respiratory distress.     Breath sounds: Normal breath sounds.  Musculoskeletal:     Right lower leg: No edema.     Left lower leg: No edema.  Skin:    Capillary Refill: Capillary refill takes less than 2 seconds.  Neurological:     General: No focal deficit present.     Mental Status: He is alert and oriented to person,  place, and time.  Psychiatric:        Mood and Affect: Mood normal.        Behavior: Behavior normal.        Thought Content: Thought content normal.        Judgment: Judgment normal.         Assessment And Plan:     1. Elevated blood pressure reading in office without diagnosis of hypertension  Blood pressure today is borderline elevated  Advised to monitor his salt intake and drink adequate amounts of water  Will have him to follow up in 2 weeks for recheck on his blood pressure - CMP14+EGFR  2. Abnormal glucose  Previous history of abnormal glucose  With his frequent urination will check for diabetes - CBC no Diff - Hemoglobin A1c - POCT UA - Microalbumin  3. Urinary frequency  Urinalysis is negative for UTI  Will await results of HgbA1c.  - POCT Urinalysis Dipstick (81002)  4. Chronic bilateral low back pain without sciatica  Reports this as being ongoing  Encouraged to stretch regularly   We will follow up at next visit to see if he needs imaging studies.     Minette Brine, FNP    THE PATIENT IS ENCOURAGED TO PRACTICE SOCIAL DISTANCING DUE TO THE COVID-19 PANDEMIC.

## 2019-07-03 LAB — CBC
Hematocrit: 49.1 % (ref 37.5–51.0)
Hemoglobin: 17.2 g/dL (ref 13.0–17.7)
MCH: 31.9 pg (ref 26.6–33.0)
MCHC: 35 g/dL (ref 31.5–35.7)
MCV: 91 fL (ref 79–97)
Platelets: 229 10*3/uL (ref 150–450)
RBC: 5.39 x10E6/uL (ref 4.14–5.80)
RDW: 11.7 % (ref 11.6–15.4)
WBC: 4.6 10*3/uL (ref 3.4–10.8)

## 2019-07-03 LAB — CMP14+EGFR
ALT: 39 IU/L (ref 0–44)
AST: 33 IU/L (ref 0–40)
Albumin/Globulin Ratio: 1.8 (ref 1.2–2.2)
Albumin: 4.9 g/dL (ref 4.0–5.0)
Alkaline Phosphatase: 83 IU/L (ref 39–117)
BUN/Creatinine Ratio: 15 (ref 9–20)
BUN: 17 mg/dL (ref 6–20)
Bilirubin Total: 0.8 mg/dL (ref 0.0–1.2)
CO2: 26 mmol/L (ref 20–29)
Calcium: 10.3 mg/dL — ABNORMAL HIGH (ref 8.7–10.2)
Chloride: 95 mmol/L — ABNORMAL LOW (ref 96–106)
Creatinine, Ser: 1.16 mg/dL (ref 0.76–1.27)
GFR calc Af Amer: 91 mL/min/{1.73_m2} (ref >59)
GFR calc non Af Amer: 79 mL/min/{1.73_m2} (ref >59)
Globulin, Total: 2.8 g/dL (ref 1.5–4.5)
Glucose: 90 mg/dL (ref 65–99)
Potassium: 4.2 mmol/L (ref 3.5–5.2)
Sodium: 137 mmol/L (ref 134–144)
Total Protein: 7.7 g/dL (ref 6.0–8.5)

## 2019-07-03 LAB — HEMOGLOBIN A1C
Est. average glucose Bld gHb Est-mCnc: 91 mg/dL
Hgb A1c MFr Bld: 4.8 % (ref 4.8–5.6)

## 2019-07-16 ENCOUNTER — Other Ambulatory Visit: Payer: Self-pay

## 2019-07-16 ENCOUNTER — Ambulatory Visit (INDEPENDENT_AMBULATORY_CARE_PROVIDER_SITE_OTHER): Payer: Self-pay | Admitting: Nurse Practitioner

## 2019-07-16 ENCOUNTER — Encounter: Payer: Self-pay | Admitting: Nurse Practitioner

## 2019-07-16 VITALS — BP 130/80 | HR 84 | Temp 98.5°F | Ht 67.4 in | Wt 179.6 lb

## 2019-07-16 DIAGNOSIS — I10 Essential (primary) hypertension: Secondary | ICD-10-CM

## 2019-07-16 DIAGNOSIS — R6882 Decreased libido: Secondary | ICD-10-CM

## 2019-07-16 DIAGNOSIS — Z716 Tobacco abuse counseling: Secondary | ICD-10-CM

## 2019-07-16 NOTE — Progress Notes (Addendum)
  This visit occurred during the SARS-CoV-2 public health emergency.  Safety protocols were in place, including screening questions prior to the visit, additional usage of staff PPE, and extensive cleaning of exam room while observing appropriate contact time as indicated for disinfecting solutions.  Subjective:     Patient ID: Todd Montgomery , male    DOB: 09-Mar-1981 , 39 y.o.   MRN: [id number]   Chief Complaint  Patient presents with  . Hypertension    HPI  Decreased libido - for more than one  Feels like his mood is okay Normal life stress  Hypertension This is a new problem. Pertinent negatives include no anxiety, chest pain, headaches, malaise/fatigue or shortness of breath. Risk factors for coronary artery disease include sedentary lifestyle. Past treatments include calcium channel blockers. There are no compliance problems.  There is no history of angina. There is no history of chronic renal disease.     Past Medical History:  Diagnosis Date  . Allergy      Family History  Problem Relation Age of Onset  . Hypertension Mother   . Stroke Mother   . Healthy Sister   . Hypertension Maternal Grandmother      Current Outpatient Medications:  .  amLODipine (NORVASC) 2.5 MG tablet, Take 1 tablet (2.5 mg total) by mouth daily., Disp: 30 tablet, Rfl: 1   No Known Allergies   Review of Systems  Constitutional: Negative for malaise/fatigue.  Respiratory: Negative for shortness of breath.   Cardiovascular: Negative for chest pain.  Neurological: Negative for headaches.     Today's Vitals   07/16/19 1115  BP: 130/80  Pulse: 84  Temp: 98.5 F (36.9 C)  TempSrc: Oral  Weight: 179 lb 9.6 oz (81.5 kg)  Height: 5' 7.4" (1.712 m)  PainSc: 0-No pain   Body mass index is 27.8 kg/m.   Objective:  Physical Exam Vitals reviewed.  Constitutional:      General: He is not in acute distress.    Appearance: Normal appearance.  Cardiovascular:     Rate and Rhythm: Normal rate  and regular rhythm.     Pulses: Normal pulses.     Heart sounds: No murmur.  Pulmonary:     Effort: Pulmonary effort is normal. No respiratory distress.     Breath sounds: Normal breath sounds.  Skin:    Capillary Refill: Capillary refill takes less than 2 seconds.  Neurological:     General: No focal deficit present.     Mental Status: He is alert and oriented to person, place, and time.  Psychiatric:        Mood and Affect: Mood normal.        Behavior: Behavior normal.        Thought Content: Thought content normal.        Judgment: Judgment normal.         Assessment And Plan:     1. Essential hypertension  Improved with low dose amlodipine  2. Decreased libido  Will check testosterone level  Pending results will recheck in next couple of days. - Testosterone  3. Tobacco abuse counseling  Smoking cessation instruction/counseling given:  counseled patient on the dangers of tobacco use, advised patient to stop smoking, and reviewed strategies to maximize success   Arnette Felts, FNP    THE PATIENT IS ENCOURAGED TO PRACTICE SOCIAL DISTANCING DUE TO THE COVID-19 PANDEMIC.

## 2019-07-17 LAB — TESTOSTERONE: Testosterone: 216 ng/dL — ABNORMAL LOW (ref 264–916)

## 2019-07-18 ENCOUNTER — Other Ambulatory Visit: Payer: Self-pay

## 2019-07-22 ENCOUNTER — Other Ambulatory Visit: Payer: Self-pay

## 2019-07-22 DIAGNOSIS — R6882 Decreased libido: Secondary | ICD-10-CM

## 2019-07-22 DIAGNOSIS — R7989 Other specified abnormal findings of blood chemistry: Secondary | ICD-10-CM

## 2019-07-23 LAB — TESTOSTERONE: Testosterone: 209 ng/dL — ABNORMAL LOW (ref 264–916)

## 2019-07-24 ENCOUNTER — Ambulatory Visit: Payer: Self-pay | Admitting: Family Medicine

## 2019-08-05 MED FILL — AMLODIPINE 2.5 MG TABLET: 2.5 | 30 days supply | Qty: 30 | Fill #1

## 2019-08-15 ENCOUNTER — Other Ambulatory Visit: Payer: Self-pay | Admitting: Nurse Practitioner

## 2019-08-15 ENCOUNTER — Other Ambulatory Visit: Payer: Self-pay

## 2019-08-15 DIAGNOSIS — R7989 Other specified abnormal findings of blood chemistry: Secondary | ICD-10-CM

## 2019-08-15 MED ORDER — TESTOSTERONE 1.62 % TD GEL: 1.0000 "application " | Freq: Every day | TRANSDERMAL | 2 refills | Status: DC

## 2019-08-15 MED FILL — TESTOSTERONE 20.25 MG/ACT (: 20.25 MG/AC | 60 days supply | Qty: 75 | Fill #0

## 2019-10-15 ENCOUNTER — Other Ambulatory Visit: Payer: Self-pay

## 2019-10-15 DIAGNOSIS — R030 Elevated blood-pressure reading, without diagnosis of hypertension: Secondary | ICD-10-CM

## 2019-10-15 MED ORDER — AMLODIPINE BESYLATE 2.5 MG PO TABS: 2.5000 mg | ORAL_TABLET | Freq: Every day | ORAL | 1 refills | Status: DC

## 2019-10-15 MED FILL — AMLODIPINE 2.5 MG TABLET: 2.5 | 30 days supply | Qty: 30 | Fill #0

## 2019-10-17 ENCOUNTER — Ambulatory Visit: Payer: Self-pay | Admitting: Nurse Practitioner

## 2019-10-28 ENCOUNTER — Ambulatory Visit (INDEPENDENT_AMBULATORY_CARE_PROVIDER_SITE_OTHER): Payer: Self-pay | Admitting: Nurse Practitioner

## 2019-10-28 ENCOUNTER — Other Ambulatory Visit: Payer: Self-pay

## 2019-10-28 ENCOUNTER — Encounter: Payer: Self-pay | Admitting: Nurse Practitioner

## 2019-10-28 VITALS — BP 128/80 | HR 84 | Temp 98.4°F | Ht 68.4 in | Wt 178.6 lb

## 2019-10-28 DIAGNOSIS — I10 Essential (primary) hypertension: Secondary | ICD-10-CM

## 2019-10-28 NOTE — Progress Notes (Signed)
This visit occurred during the SARS-CoV-2 public health emergency.  Safety protocols were in place, including screening questions prior to the visit, additional usage of staff PPE, and extensive cleaning of exam room while observing appropriate contact time as indicated for disinfecting solutions.  Subjective:     Patient ID: Todd Montgomery , male    DOB: [birth year] , 39 y.o.   MRN: [id number]   Chief Complaint  Patient presents with  . Hypertension    HPI  He is taking testosterone daily with the gel   Hypertension This is a new problem. The current episode started 1 to 4 weeks ago. The problem is controlled. Pertinent negatives include no anxiety, chest pain, headaches, malaise/fatigue or shortness of breath. There are no associated agents to hypertension. Risk factors for coronary artery disease include sedentary lifestyle. Past treatments include calcium channel blockers. There are no compliance problems.  There is no history of angina. There is no history of chronic renal disease.     Past Medical History:  Diagnosis Date  . Allergy      Family History  Problem Relation Age of Onset  . Hypertension Mother   . Stroke Mother   . Healthy Sister   . Hypertension Maternal Grandmother      Current Outpatient Medications:  .  amLODipine (NORVASC) 2.5 MG tablet, Take 1 tablet (2.5 mg total) by mouth daily., Disp: 30 tablet, Rfl: 1 .  Testosterone 1.62 % GEL, Place 1 application onto the skin daily., Disp: 75 g, Rfl: 2   No Known Allergies   Review of Systems  Constitutional: Negative.  Negative for malaise/fatigue.  Respiratory: Negative for shortness of breath.   Cardiovascular: Negative for chest pain.  Neurological: Negative for dizziness and headaches.  Psychiatric/Behavioral: Negative.      Today's Vitals   10/28/19 1116  BP: 128/80  Pulse: 84  Temp: 98.4 F (36.9 C)  TempSrc: Oral  Weight: 178 lb 9.6 oz (81 kg)  Height: 5' 8.4" (1.737 m)  PainSc: 0-No pain    Body mass index is 26.84 kg/m.   Objective:  Physical Exam Vitals reviewed.  Constitutional:      General: He is not in acute distress.    Appearance: Normal appearance.  Cardiovascular:     Rate and Rhythm: Normal rate and regular rhythm.     Pulses: Normal pulses.     Heart sounds: No murmur.  Pulmonary:     Effort: Pulmonary effort is normal. No respiratory distress.     Breath sounds: Normal breath sounds.  Skin:    Capillary Refill: Capillary refill takes less than 2 seconds.     Comments: Fingernail clubbing noted with prominent rounding to the joint.   Neurological:     General: No focal deficit present.     Mental Status: He is alert and oriented to person, place, and time.  Psychiatric:        Mood and Affect: Mood normal.        Behavior: Behavior normal.        Thought Content: Thought content normal.        Judgment: Judgment normal.         Assessment And Plan:     1. Essential hypertension  stable with low dose amlodipine  Continue with current medications.  2. Decreased libido  He has only been consistent with testosterone gel for about one month, will continue over the next 3 months and recheck levels.   If those are normal and  continues to have decreased libido will consider other causes      Arnette Felts, FNP    THE PATIENT IS ENCOURAGED TO PRACTICE SOCIAL DISTANCING DUE TO THE COVID-19 PANDEMIC.

## 2019-10-29 LAB — BMP8+EGFR
BUN/Creatinine Ratio: 11 (ref 9–20)
BUN: 11 mg/dL (ref 6–24)
CO2: 19 mmol/L — ABNORMAL LOW (ref 20–29)
Calcium: 9.3 mg/dL (ref 8.7–10.2)
Chloride: 101 mmol/L (ref 96–106)
Creatinine, Ser: 0.99 mg/dL (ref 0.76–1.27)
GFR calc Af Amer: 110 mL/min/{1.73_m2} (ref >59)
GFR calc non Af Amer: 95 mL/min/{1.73_m2} (ref >59)
Glucose: 125 mg/dL — ABNORMAL HIGH (ref 65–99)
Potassium: 3.7 mmol/L (ref 3.5–5.2)
Sodium: 139 mmol/L (ref 134–144)

## 2019-11-15 ENCOUNTER — Ambulatory Visit (INDEPENDENT_AMBULATORY_CARE_PROVIDER_SITE_OTHER): Payer: Self-pay

## 2019-11-15 ENCOUNTER — Ambulatory Visit: Payer: Self-pay

## 2019-11-15 DIAGNOSIS — Z23 Encounter for immunization: Secondary | ICD-10-CM

## 2019-11-15 NOTE — Progress Notes (Signed)
   Covid-[facility] Vaccination [facility]  Name:  Todd Montgomery    MRN: [id number] DOB: 25-Dec-1980  11/15/2019  Mr. Todd Montgomery was observed post Covid-19 immunization for 15 minutes without incident. He was provided with Vaccine Information Sheet and instruction to access the V-Safe system.   Mr. Todd Montgomery was instructed to call 911 with any severe reactions post vaccine: Marland Kitchen Difficulty breathing  . Swelling of face and throat  . A fast heartbeat  . A bad rash all over body  . Dizziness and weakness   Immunizations Administered    Name Date Dose VIS Date Route   Moderna COVID-19 Vaccine 11/15/2019  9:35 AM 0.5 mL 05/2019 Intramuscular   Manufacturer: Moderna   Lot: [id number]   NDC: 13643-837-79

## 2019-12-10 MED FILL — AMLODIPINE 2.5 MG TABLET: 2.5 | 30 days supply | Qty: 30 | Fill #1

## 2019-12-13 ENCOUNTER — Ambulatory Visit (INDEPENDENT_AMBULATORY_CARE_PROVIDER_SITE_OTHER): Payer: Self-pay

## 2019-12-13 DIAGNOSIS — Z23 Encounter for immunization: Secondary | ICD-10-CM

## 2019-12-13 NOTE — Progress Notes (Signed)
   Covid-19 Vaccination Clinic  Name:  Todd Montgomery    MRN: [id number] DOB: 1981-03-23  12/13/2019  Todd Montgomery was observed post Covid-19 immunization for 15 minutes without incident. He was provided with Vaccine Information Sheet and instruction to access the V-Safe system.   Todd Montgomery was instructed to call 911 with any severe reactions post vaccine: Marland Kitchen Difficulty breathing  . Swelling of face and throat  . A fast heartbeat  . A bad rash all over body  . Dizziness and weakness   Immunizations Administered    Name Date Dose VIS Date Route   Moderna COVID-19 Vaccine 12/13/2019 10:29 AM 0.5 mL 05/2019 Intramuscular   Manufacturer: Moderna   Lot: [id number]   NDC: 20100-712-19

## 2020-01-14 ENCOUNTER — Other Ambulatory Visit: Payer: Self-pay

## 2020-01-28 ENCOUNTER — Ambulatory Visit: Payer: Self-pay | Admitting: Nurse Practitioner

## 2020-02-13 ENCOUNTER — Other Ambulatory Visit: Payer: Self-pay

## 2020-02-13 DIAGNOSIS — R030 Elevated blood-pressure reading, without diagnosis of hypertension: Secondary | ICD-10-CM

## 2020-02-13 MED ORDER — AMLODIPINE BESYLATE 2.5 MG PO TABS: 2.5000 mg | ORAL_TABLET | Freq: Every day | ORAL | 0 refills | Status: DC

## 2020-02-13 MED FILL — AMLODIPINE 2.5 MG TABLET: 2.5 | 90 days supply | Qty: 90 | Fill #0

## 2020-02-18 ENCOUNTER — Ambulatory Visit (INDEPENDENT_AMBULATORY_CARE_PROVIDER_SITE_OTHER): Payer: 59 | Admitting: Nurse Practitioner

## 2020-02-18 ENCOUNTER — Other Ambulatory Visit: Payer: Self-pay

## 2020-02-18 ENCOUNTER — Encounter: Payer: Self-pay | Admitting: Nurse Practitioner

## 2020-02-18 VITALS — BP 148/96 | HR 79 | Temp 98.5°F | Ht 67.6 in | Wt 178.2 lb

## 2020-02-18 DIAGNOSIS — Z1159 Encounter for screening for other viral diseases: Secondary | ICD-10-CM

## 2020-02-18 DIAGNOSIS — R7989 Other specified abnormal findings of blood chemistry: Secondary | ICD-10-CM | POA: Diagnosis not present

## 2020-02-18 DIAGNOSIS — R0683 Snoring: Secondary | ICD-10-CM

## 2020-02-18 DIAGNOSIS — R6882 Decreased libido: Secondary | ICD-10-CM | POA: Diagnosis not present

## 2020-02-18 DIAGNOSIS — R5383 Other fatigue: Secondary | ICD-10-CM | POA: Diagnosis not present

## 2020-02-18 DIAGNOSIS — I10 Essential (primary) hypertension: Secondary | ICD-10-CM

## 2020-02-18 NOTE — Progress Notes (Signed)
I,Yamilka Roman [employer] as a Education administrator for [employer], FNP.,have documented all relevant documentation on the behalf of Minette Brine, FNP,as directed by  Minette Brine, FNP while in the presence of Minette Brine, [place]. This visit occurred during the SARS-CoV-2 public health emergency.  Safety protocols were in place, including screening questions prior to the visit, additional usage of staff PPE, and extensive cleaning of exam room while observing appropriate contact time as indicated for disinfecting solutions.  Subjective:     Patient ID: Todd Montgomery , male    DOB: 1980/09/09 , 39 y.o.   MRN: [id number]   Chief Complaint  Patient presents with  . Hypertension  . testosterone    HPI  He has not been taking the testosterone due to continued feeling of being fatigued.    He has been without his medication since Friday will go and pick up prescription after leaving the office.  Hypertension This is a new problem. The current episode started 1 to 4 weeks ago. The problem is controlled. Pertinent negatives include no anxiety, chest pain, headaches, malaise/fatigue, palpitations or shortness of breath. There are no associated agents to hypertension. Risk factors for coronary artery disease include sedentary lifestyle. Past treatments include calcium channel blockers. There are no compliance problems.  There is no history of angina. There is no history of chronic renal disease.     Past Medical History:  Diagnosis Date  . Allergy      Family History  Problem Relation Age of Onset  . Hypertension Mother   . Stroke Mother   . Healthy Sister   . Hypertension Maternal Grandmother      Current Outpatient Medications:  .  amLODipine (NORVASC) 2.5 MG tablet, Take 1 tablet (2.5 mg total) by mouth daily., Disp: 90 tablet, Rfl: 0   No Known Allergies   Review of Systems  Constitutional: Negative for malaise/fatigue.  Respiratory: Negative for shortness of breath and wheezing.    Cardiovascular: Negative for chest pain, palpitations and leg swelling.  Neurological: Negative for dizziness and headaches.  Psychiatric/Behavioral: Negative.      Today's Vitals   02/18/20 0838  BP: (!) 148/96  Pulse: 79  Temp: 98.5 F (36.9 C)  TempSrc: Oral  Weight: 178 lb 3.2 oz (80.8 kg)  Height: 5' 7.6" (1.717 m)  PainSc: 0-No pain   Body mass index is 27.42 kg/m.   Objective:  Physical Exam Constitutional:      General: He is not in acute distress.    Appearance: Normal appearance.  Cardiovascular:     Rate and Rhythm: Normal rate and regular rhythm.     Pulses: Normal pulses.     Heart sounds: Normal heart sounds. No murmur heard.   Pulmonary:     Effort: Pulmonary effort is normal. No respiratory distress.     Breath sounds: Normal breath sounds.  Neurological:     General: No focal deficit present.     Mental Status: He is alert and oriented to person, place, and time.     Cranial Nerves: No cranial nerve deficit.  Psychiatric:        Mood and Affect: Mood normal.        Behavior: Behavior normal.        Thought Content: Thought content normal.        Judgment: Judgment normal.         Assessment And Plan:     1. Low testosterone Comments: He has not been taking the testosterone did not  feel effective will discontinue medication, will refer to Urology for further evaluation and additional options - Ambulatory referral to Urology  2. Decreased libido Comments: Referral to Urology, testosterone was ineffective - Ambulatory referral to Urology  3. Snoring Comments: This may be the cause of his fatigue, will refer for a sleep evaluation.  Metabolic labs were normal except testosterone - Ambulatory referral to Sleep Studies  4. Other fatigue Comments: Will refer for sleep evaluation. Previous metabolic labs normal except testosterone - Ambulatory referral to Sleep Studies  5. Essential hypertension Comments: Chronic, slightly elevated today He  has not taken his medications since Friday Encouraged adherence to medications - BMP8+eGFR  6. Encounter for hepatitis C screening test for low risk patient  Will check Hepatitis C screening due to recent recommendations to screen all adults 18 years and older - Hepatitis C antibody     Patient was given opportunity to ask questions. Patient verbalized understanding of the plan and was able to repeat key elements of the plan. All questions were answered to their satisfaction.  Minette Brine, FNP   I, Minette Brine, FNP, have reviewed all documentation for this visit. The documentation on 02/18/20 for the exam, diagnosis, procedures, and orders are all accurate and complete.  THE PATIENT IS ENCOURAGED TO PRACTICE SOCIAL DISTANCING DUE TO THE COVID-19 PANDEMIC.

## 2020-02-18 NOTE — Patient Instructions (Addendum)

## 2020-02-19 LAB — HEPATITIS C ANTIBODY: Hep C Virus Ab: <0.1 s/co ratio (ref 0.0–0.9)

## 2020-02-19 LAB — BMP8+EGFR
BUN/Creatinine Ratio: 9 (ref 9–20)
BUN: 9 mg/dL (ref 6–24)
CO2: 23 mmol/L (ref 20–29)
Calcium: 9.7 mg/dL (ref 8.7–10.2)
Chloride: 102 mmol/L (ref 96–106)
Creatinine, Ser: 0.97 mg/dL (ref 0.76–1.27)
GFR calc Af Amer: 112 mL/min/{1.73_m2} (ref >59)
GFR calc non Af Amer: 97 mL/min/{1.73_m2} (ref >59)
Glucose: 83 mg/dL (ref 65–99)
Potassium: 4 mmol/L (ref 3.5–5.2)
Sodium: 140 mmol/L (ref 134–144)

## 2020-03-23 ENCOUNTER — Institutional Professional Consult (permissible substitution): Payer: 59 | Admitting: Neurology

## 2020-04-14 NOTE — Progress Notes (Signed)
Note created in error.

## 2020-05-13 ENCOUNTER — Encounter: Payer: 59 | Admitting: Nurse Practitioner

## 2020-05-19 ENCOUNTER — Other Ambulatory Visit: Payer: Self-pay

## 2020-05-19 ENCOUNTER — Other Ambulatory Visit: Payer: Self-pay | Admitting: Nurse Practitioner

## 2020-05-19 ENCOUNTER — Ambulatory Visit (INDEPENDENT_AMBULATORY_CARE_PROVIDER_SITE_OTHER): Payer: 59 | Admitting: Nurse Practitioner

## 2020-05-19 ENCOUNTER — Encounter: Payer: Self-pay | Admitting: Nurse Practitioner

## 2020-05-19 VITALS — BP 130/76 | HR 92 | Temp 98.4°F | Ht 67.6 in | Wt 174.0 lb

## 2020-05-19 DIAGNOSIS — Z6826 Body mass index (BMI) 26.0-26.9, adult: Secondary | ICD-10-CM

## 2020-05-19 DIAGNOSIS — I10 Essential (primary) hypertension: Secondary | ICD-10-CM | POA: Diagnosis not present

## 2020-05-19 DIAGNOSIS — Z23 Encounter for immunization: Secondary | ICD-10-CM

## 2020-05-19 DIAGNOSIS — Z0000 Encounter for general adult medical examination without abnormal findings: Secondary | ICD-10-CM

## 2020-05-19 DIAGNOSIS — E663 Overweight: Secondary | ICD-10-CM

## 2020-05-19 DIAGNOSIS — R7989 Other specified abnormal findings of blood chemistry: Secondary | ICD-10-CM | POA: Diagnosis not present

## 2020-05-19 LAB — POCT URINALYSIS DIPSTICK
Blood, UA: NEGATIVE
Glucose, UA: NEGATIVE
Ketones, UA: NEGATIVE
Leukocytes, UA: NEGATIVE
Nitrite, UA: NEGATIVE
Protein, UA: POSITIVE — AB
Spec Grav, UA: 1.025 (ref 1.010–1.025)
Urobilinogen, UA: 1 E.U./dL
pH, UA: 7 (ref 5.0–8.0)

## 2020-05-19 LAB — POCT UA - MICROALBUMIN
Creatinine, POC: 300 mg/dL
Microalbumin Ur, POC: 80 mg/L

## 2020-05-19 MED ORDER — AMLODIPINE BESYLATE 2.5 MG PO TABS: 2.5000 mg | ORAL_TABLET | Freq: Every day | ORAL | 0 refills | Status: DC

## 2020-05-19 MED FILL — AMLODIPINE 2.5 MG TABLET: 2.5 | 90 days supply | Qty: 90 | Fill #0

## 2020-05-19 NOTE — Progress Notes (Signed)
I,Tianna Badgett,acting as a Education administrator for [employer], NP.,have documented all relevant documentation on the behalf of [employer], NP,as directed by  Bary Castilla, NP while in the presence of Bary Castilla, NP.  This visit occurred during the SARS-CoV-2 public health emergency.  Safety protocols were in place, including screening questions prior to the visit, additional usage of staff PPE, and extensive cleaning of exam room while observing appropriate contact time as indicated for disinfecting solutions.  Subjective:     Patient ID: Todd Montgomery , male    DOB: 11-Feb-1981 , 39 y.o.   MRN: [id number]   Chief Complaint  Patient presents with  . Annual Exam    HPI  Patient is here today for full physical exam. He has no concerns at this time. He reports compliance with medication.   He did not go to the urologist yet. He will make an appointment with them. Patient given urologist info. To make a appointment with them.   He is not exercising or following any type of diet at this time.  Otherwise doing well.     Wt Readings from Last Encounters: 05/19/20 : 174 lb (78.9 kg) 02/18/20 : 178 lb 3.2 oz (80.8 kg) 10/28/19 : 178 lb 9.6 oz (81 kg)   Hypertension This is a new problem. The current episode started 1 to 4 weeks ago. The problem is controlled. Pertinent negatives include no anxiety, chest pain, headaches, malaise/fatigue or shortness of breath. There are no associated agents to hypertension. Risk factors for coronary artery disease include sedentary lifestyle. Past treatments include calcium channel blockers. There are no compliance problems.  There is no history of angina. There is no history of chronic renal disease.     Past Medical History:  Diagnosis Date  . Allergy      Family History  Problem Relation Age of Onset  . Hypertension Mother   . Stroke Mother   . Healthy Sister   . Hypertension Maternal Grandmother      Current Outpatient  Medications:  .  amLODipine (NORVASC) 2.5 MG tablet, Take 1 tablet (2.5 mg total) by mouth daily., Disp: 90 tablet, Rfl: 0   No Known Allergies   Men's preventive visit. Patient Health Questionnaire (PHQ-2) is    Office Visit from 07/02/2019 in [facility]  PHQ-2 Total Score 0    . Patient is not on a  diet. Marital status: Single. Relevant history for alcohol use is: 2-3 shots per day.   Social History   Substance and Sexual Activity  Alcohol Use Yes  . Relevant history for tobacco use is:  Social History   Tobacco Use  Smoking Status Current Every Day Smoker  . Packs/day: 0.50  . Types: Cigarettes  Smokeless Tobacco Never Used  He smokes a pack every d days.   Review of Systems  Constitutional: Negative.  Negative for fatigue, fever and malaise/fatigue.  HENT: Negative.  Negative for ear pain and sinus pain.   Eyes: Negative.  Negative for pain and redness.  Respiratory: Negative.  Negative for cough, shortness of breath and wheezing.   Cardiovascular: Negative.  Negative for chest pain and leg swelling.  Gastrointestinal: Negative.  Negative for diarrhea, nausea and vomiting.  Endocrine: Negative.  Negative for polydipsia, polyphagia and polyuria.  Genitourinary: Negative.  Negative for urgency.  Musculoskeletal: Negative.  Negative for myalgias.  Skin: Negative.   Allergic/Immunologic: Negative.   Neurological: Negative.  Negative for weakness and headaches.  Hematological: Negative.   Psychiatric/Behavioral: Negative.  Negative for agitation and behavioral problems.     Today's Vitals   05/19/20 1607  BP: 130/76  Pulse: 92  Temp: 98.4 F (36.9 C)  TempSrc: Oral  Weight: 174 lb (78.9 kg)  Height: 5' 7.6" (1.717 m)   Body mass index is 26.77 kg/m.  Wt Readings from Last 3 Encounters:  05/19/20 174 lb (78.9 kg)  02/18/20 178 lb 3.2 oz (80.8 kg)  10/28/19 178 lb 9.6 oz (81 kg)     Objective:  Physical Exam Constitutional:       Appearance: Normal appearance.  HENT:     Head: Normocephalic.     Right Ear: Tympanic membrane and ear canal normal. There is no impacted cerumen.     Left Ear: Tympanic membrane and ear canal normal. There is no impacted cerumen.     Nose:     Comments: Deferred. Patient is masked     Mouth/Throat:     Comments: Deferred. Patient is masked.  Eyes:     Extraocular Movements: Extraocular movements intact.     Conjunctiva/sclera: Conjunctivae normal.     Pupils: Pupils are equal, round, and reactive to light.  Cardiovascular:     Rate and Rhythm: Normal rate and regular rhythm.     Pulses: Normal pulses.     Heart sounds: Normal heart sounds. No murmur heard.   Pulmonary:     Effort: Pulmonary effort is normal.     Breath sounds: Normal breath sounds. No wheezing or rales.  Abdominal:     General: Bowel sounds are normal.     Palpations: Abdomen is soft.  Genitourinary:    Comments: Deferred will check PSA  Musculoskeletal:        General: No tenderness. Normal range of motion.     Cervical back: Normal range of motion. No tenderness.     Right lower leg: No edema.     Left lower leg: No edema.  Skin:    General: Skin is warm and dry.     Capillary Refill: Capillary refill takes less than 2 seconds.     Findings: No rash.  Neurological:     Mental Status: He is alert and oriented to person, place, and time. Mental status is at baseline.     Motor: No weakness.  Psychiatric:        Mood and Affect: Mood normal.        Behavior: Behavior normal.         Assessment And Plan:    1. Encounter for annual physical exam -Discussed the need for behavior and diet modification which includes eating healthier with fruits, greens and vegetables.  -Discussed the importance of exercising atleast 4-5 days a week for atleast 30-45 min. Daily.  - CBC - Lipid panel - PSA - POCT Urinalysis Dipstick (81002) - POCT UA - Microalbumin - EKG 12-Lead NSR   2. Essential hypertension -  Hemoglobin A1c - CMP14+EGFR - Refilled amLODipine (NORVASC) 2.5 MG tablet; Take 1 tablet (2.5 mg total) by mouth daily.  Dispense: 90 tablet; Refill: 0 -BP controlled on medication  -EKG NSR  -Limit intake of sodium and processed food   3. Overweight with body mass index (BMI) of 26 to 26.9 in adult - Advised patient to eat healthy, limit fast food intake, exercise and decrease the intake of red meat and increase intake of fish.   4. Low testosterone -Will recheck testosterone in 3 months.  -Patient given contact info about urologist he was suppose to  follow up with.   5. Need for Tdap vaccination - Tdap vaccine greater than or equal to 7yo IM -Given today in office and discussed side-effects.    He is encouraged to initially strive for BMI less than 26 to decrease cardiac risk. He is advised to exercise no less than 150 minutes per week.   Patient was given opportunity to ask questions. Patient verbalized understanding of the plan and was able to repeat key elements of the plan. All questions were answered to their satisfaction.   Bary Castilla, NP   I, Bary Castilla, NP, have reviewed all documentation for this visit. The documentation on 05/19/20 for the exam, diagnosis, procedures, and orders are all accurate and complete.  THE PATIENT IS ENCOURAGED TO PRACTICE SOCIAL DISTANCING DUE TO THE COVID-19 PANDEMIC.

## 2020-05-19 NOTE — Patient Instructions (Addendum)
Dr Vernie Ammons   [phone] please call to check on Urology appointment.        Health Maintenance, Male Adopting a healthy lifestyle and getting preventive care are important in promoting health and wellness. Ask your health care provider about:  The right schedule for you to have regular tests and exams.  Things you can do on your own to prevent diseases and keep yourself healthy. What should I know about diet, weight, and exercise? Eat a healthy diet   Eat a diet that includes plenty of vegetables, fruits, low-fat dairy products, and lean protein.  Do not eat a lot of foods that are high in solid fats, added sugars, or sodium. Maintain a healthy weight Body mass index (BMI) is a measurement that can be used to identify possible weight problems. It estimates body fat based on height and weight. Your health care provider can help determine your BMI and help you achieve or maintain a healthy weight. Get regular exercise Get regular exercise. This is one of the most important things you can do for your health. Most adults should:  Exercise for at least 150 minutes each week. The exercise should increase your heart rate and make you sweat (moderate-intensity exercise).  Do strengthening exercises at least twice a week. This is in addition to the moderate-intensity exercise.  Spend less time sitting. Even light physical activity can be beneficial. Watch cholesterol and blood lipids Have your blood tested for lipids and cholesterol at 40 years of age, then have this test every 5 years. You may need to have your cholesterol levels checked more often if:  Your lipid or cholesterol levels are high.  You are older than 40 years of age.  You are at high risk for heart disease. What should I know about cancer screening? Many types of cancers can be detected early and may often be prevented. Depending on your health history and family history, you may need to have cancer screening at various  ages. This may include screening for:  Colorectal cancer.  Prostate cancer.  Skin cancer.  Lung cancer. What should I know about heart disease, diabetes, and high blood pressure? Blood pressure and heart disease  High blood pressure causes heart disease and increases the risk of stroke. This is more likely to develop in people who have high blood pressure readings, are of African descent, or are overweight.  Talk with your health care provider about your target blood pressure readings.  Have your blood pressure checked: ? Every 3-5 years if you are 79-78 years of age. ? Every year if you are 92 years old or older.  If you are between the ages of 12 and 61 and are a current or former smoker, ask your health care provider if you should have a one-time screening for abdominal aortic aneurysm (AAA). Diabetes Have regular diabetes screenings. This checks your fasting blood sugar level. Have the screening done:  Once every three years after age 11 if you are at a normal weight and have a low risk for diabetes.  More often and at a younger age if you are overweight or have a high risk for diabetes. What should I know about preventing infection? Hepatitis B If you have a higher risk for hepatitis B, you should be screened for this virus. Talk with your health care provider to find out if you are at risk for hepatitis B infection. Hepatitis C Blood testing is recommended for:  Everyone born from [birth year] through 1966.  Anyone with known risk factors for hepatitis C. Sexually transmitted infections (STIs)  You should be screened each year for STIs, including gonorrhea and chlamydia, if: ? You are sexually active and are younger than 40 years of age. ? You are older than 40 years of age and your health care provider tells you that you are at risk for this type of infection. ? Your sexual activity has changed since you were last screened, and you are at increased risk for chlamydia or  gonorrhea. Ask your health care provider if you are at risk.  Ask your health care provider about whether you are at high risk for HIV. Your health care provider may recommend a prescription medicine to help prevent HIV infection. If you choose to take medicine to prevent HIV, you should first get tested for HIV. You should then be tested every 3 months for as long as you are taking the medicine. Follow these instructions at home: Lifestyle  Do not use any products that contain nicotine or tobacco, such as cigarettes, e-cigarettes, and chewing tobacco. If you need help quitting, ask your health care provider.  Do not use street drugs.  Do not share needles.  Ask your health care provider for help if you need support or information about quitting drugs. Alcohol use  Do not drink alcohol if your health care provider tells you not to drink.  If you drink alcohol: ? Limit how much you have to 0-2 drinks a day. ? Be aware of how much alcohol is in your drink. In the [place] one drink equals one 12 oz bottle of beer (355 mL), one 5 oz glass of wine (148 mL), or one 1 oz glass of hard liquor (44 mL). General instructions  Schedule regular health, dental, and eye exams.  Stay current with your vaccines.  Tell your health care provider if: ? You often feel depressed. ? You have ever been abused or do not feel safe at home. Summary  Adopting a healthy lifestyle and getting preventive care are important in promoting health and wellness.  Follow your health care provider's instructions about healthy diet, exercising, and getting tested or screened for diseases.  Follow your health care provider's instructions on monitoring your cholesterol and blood pressure. This information is not intended to replace advice given to you by your health care provider. Make sure you discuss any questions you have with your health care provider. Document Revised: 05/30/2018 Document Reviewed:  05/30/2018 Elsevier Patient Education  2020 [employer].

## 2020-05-20 LAB — CMP14+EGFR
ALT: 92 IU/L — ABNORMAL HIGH (ref 0–44)
AST: 87 IU/L — ABNORMAL HIGH (ref 0–40)
Albumin/Globulin Ratio: 1.5 (ref 1.2–2.2)
Albumin: 4.6 g/dL (ref 4.0–5.0)
Alkaline Phosphatase: 75 IU/L (ref 44–121)
BUN/Creatinine Ratio: 11 (ref 9–20)
BUN: 11 mg/dL (ref 6–24)
Bilirubin Total: 0.7 mg/dL (ref 0.0–1.2)
CO2: 23 mmol/L (ref 20–29)
Calcium: 9.8 mg/dL (ref 8.7–10.2)
Chloride: 103 mmol/L (ref 96–106)
Creatinine, Ser: 1.02 mg/dL (ref 0.76–1.27)
GFR calc Af Amer: 106 mL/min/{1.73_m2} (ref >59)
GFR calc non Af Amer: 92 mL/min/{1.73_m2} (ref >59)
Globulin, Total: 3 g/dL (ref 1.5–4.5)
Glucose: 84 mg/dL (ref 65–99)
Potassium: 4.2 mmol/L (ref 3.5–5.2)
Sodium: 143 mmol/L (ref 134–144)
Total Protein: 7.6 g/dL (ref 6.0–8.5)

## 2020-05-20 LAB — CBC
Hematocrit: 43 % (ref 37.5–51.0)
Hemoglobin: 15.3 g/dL (ref 13.0–17.7)
MCH: 32.7 pg (ref 26.6–33.0)
MCHC: 35.6 g/dL (ref 31.5–35.7)
MCV: 92 fL (ref 79–97)
Platelets: 231 10*3/uL (ref 150–450)
RBC: 4.68 x10E6/uL (ref 4.14–5.80)
RDW: 12 % (ref 11.6–15.4)
WBC: 3.7 10*3/uL (ref 3.4–10.8)

## 2020-05-20 LAB — LIPID PANEL
Chol/HDL Ratio: 3.2 ratio (ref 0.0–5.0)
Cholesterol, Total: 266 mg/dL — ABNORMAL HIGH (ref 100–199)
HDL: 82 mg/dL (ref >39)
LDL Chol Calc (NIH): 166 mg/dL — ABNORMAL HIGH (ref 0–99)
Triglycerides: 104 mg/dL (ref 0–149)
VLDL Cholesterol Cal: 18 mg/dL (ref 5–40)

## 2020-05-20 LAB — HEMOGLOBIN A1C
Est. average glucose Bld gHb Est-mCnc: 97 mg/dL
Hgb A1c MFr Bld: 5 % (ref 4.8–5.6)

## 2020-05-20 LAB — PSA: Prostate Specific Ag, Serum: 4.4 ng/mL — ABNORMAL HIGH (ref 0.0–4.0)

## 2020-06-22 ENCOUNTER — Telehealth: Payer: Self-pay

## 2020-06-22 ENCOUNTER — Other Ambulatory Visit: Payer: Self-pay

## 2020-06-22 ENCOUNTER — Encounter: Payer: Self-pay | Admitting: Nurse Practitioner

## 2020-06-22 ENCOUNTER — Telehealth (INDEPENDENT_AMBULATORY_CARE_PROVIDER_SITE_OTHER): Payer: 59 | Admitting: Nurse Practitioner

## 2020-06-22 VITALS — Wt 165.0 lb

## 2020-06-22 DIAGNOSIS — R0981 Nasal congestion: Secondary | ICD-10-CM

## 2020-06-22 DIAGNOSIS — Z20822 Contact with and (suspected) exposure to covid-19: Secondary | ICD-10-CM

## 2020-06-22 NOTE — Telephone Encounter (Signed)
Patient consented to virtual appointment. YL,RMA 

## 2020-06-22 NOTE — Progress Notes (Signed)
Virtual Visit via Telephone   This visit type was conducted due to national recommendations for restrictions regarding the COVID-19 Pandemic (e.g. social distancing) in an effort to limit this patient's exposure and mitigate transmission in our community.  Due to his co-morbid illnesses, this patient is at least at moderate risk for complications without adequate follow up.  This format is felt to be most appropriate for this patient at this time.  All issues noted in this document were discussed and addressed.  A limited physical exam was performed with this format.    This visit type was conducted due to national recommendations for restrictions regarding the COVID-19 Pandemic (e.g. social distancing) in an effort to limit this patient's exposure and mitigate transmission in our community.  Patients identity confirmed using two different identifiers.  This format is felt to be most appropriate for this patient at this time.  All issues noted in this document were discussed and addressed.  No physical exam was performed (except for noted visual exam findings with Video Visits).    Date:  06/22/2020   ID:  Todd Montgomery, DOB 01/04/1981, MRN [id number]  Patient Location:  Home - spoke with [name]  Provider location:   Office    Chief Complaint:  Exposure to covid  History of Present Illness:    Todd Montgomery is a 40 y.o. male who presents via video conferencing for a telehealth visit today.    The patient does have symptoms concerning for COVID-19 infection (fever, chills, cough, or new shortness of breath).   Virtual visit done due to He is having symptoms last night of body aches, chills, headache and "sweating", runny nose.   He has been exposed to covid in recent days.  He is fully vaccinated.      Past Medical History:  Diagnosis Date  . Allergy    History reviewed. No pertinent surgical history.   Current Meds  Medication Sig  . amLODipine (NORVASC) 2.5 MG tablet Take  1 tablet (2.5 mg total) by mouth daily.     Allergies:   Patient has no known allergies.   Social History   Tobacco Use  . Smoking status: Current Every Day Smoker    Packs/day: 0.50    Types: Cigarettes  . Smokeless tobacco: Never Used  Substance Use Topics  . Alcohol use: Yes  . Drug use: No     Family Hx: The patient's family history includes Healthy in his sister; Hypertension in his maternal grandmother and mother; Stroke in his mother.  ROS:   Please see the history of present illness.    Review of Systems  Constitutional: Positive for chills. Negative for fever and malaise/fatigue.  Respiratory: Positive for cough. Negative for shortness of breath (winded when walking down the steps).   Cardiovascular: Negative.   Musculoskeletal: Negative.     All other systems reviewed and are negative.   Labs/Other Tests and Data Reviewed:    Recent Labs: 05/19/2020: ALT 92; BUN 11; Creatinine, Ser 1.02; Hemoglobin 15.3; Platelets 231; Potassium 4.2; Sodium 143   Recent Lipid Panel Lab Results  Component Value Date/Time   CHOL 266 (H) 05/19/2020 04:43 PM   TRIG 104 05/19/2020 04:43 PM   HDL 82 05/19/2020 04:43 PM   CHOLHDL 3.2 05/19/2020 04:43 PM   LDLCALC 166 (H) 05/19/2020 04:43 PM    Wt Readings from Last 3 Encounters:  06/22/20 165 lb (74.8 kg)  05/19/20 174 lb (78.9 kg)  02/18/20 178 lb 3.2 oz (80.8  kg)     Exam:    Vital Signs:  Wt 165 lb (74.8 kg)   BMI 25.39 kg/m     Physical Exam Vitals reviewed.  Constitutional:      General: He is not in acute distress. HENT:     Nose: Congestion (sounds congested) present.  Pulmonary:     Effort: Pulmonary effort is normal. No respiratory distress.  Neurological:     Mental Status: He is alert and oriented to person, place, and time.  Psychiatric:        Mood and Affect: Mood normal.        Behavior: Behavior normal.        Thought Content: Thought content normal.        Judgment: Judgment normal.      ASSESSMENT & PLAN:    1. Suspected COVID-19 virus infection  He is coming to the office to be swabbed outside  He is encouraged to treat his symptoms symptomatically  If has shortness of breath or chest pain to go to ER  He is to take vitamin c, Vitamin d and zinc daily with an aspirin 81 mg daily   He is to remain in quarantine until he has his results.  - Novel Coronavirus, NAA ([facility])  Unable to hear or see him on video with Mychart, telephone visit done  COVID-19 Education: The signs and symptoms of COVID-19 were discussed with the patient and how to seek care for testing (follow up with PCP or arrange E-visit).  The importance of social distancing was discussed today.  Patient Risk:   After full review of this patients clinical status, I feel that they are at least moderate risk at this time.  Time:   Today, I have spent 8 minutes/ seconds with the patient with telehealth technology discussing above diagnoses.     Medication Adjustments/Labs and Tests Ordered: Current medicines are reviewed at length with the patient today.  Concerns regarding medicines are outlined above.   Tests Ordered: Orders Placed This Encounter  Procedures  . Novel Coronavirus, NAA ([facility])    Medication Changes: No orders of the defined types were placed in this encounter.   Disposition:  Follow up prn  Signed, Arnette Felts, FNP

## 2020-06-24 ENCOUNTER — Encounter: Payer: Self-pay | Admitting: Nurse Practitioner

## 2020-06-25 LAB — NOVEL CORONAVIRUS, NAA: SARS-CoV-2, NAA: DETECTED — AB

## 2020-06-29 ENCOUNTER — Encounter: Payer: Self-pay | Admitting: Nurse Practitioner

## 2020-07-22 MED FILL — AMLODIPINE 2.5 MG TABLET: 2.5 | 90 days supply | Qty: 90 | Fill #0

## 2020-08-17 ENCOUNTER — Ambulatory Visit: Payer: 59 | Admitting: Nurse Practitioner

## 2020-09-09 ENCOUNTER — Other Ambulatory Visit: Payer: Self-pay | Admitting: Nurse Practitioner

## 2020-09-09 ENCOUNTER — Other Ambulatory Visit: Payer: Self-pay

## 2020-09-09 ENCOUNTER — Ambulatory Visit (INDEPENDENT_AMBULATORY_CARE_PROVIDER_SITE_OTHER): Payer: 59 | Admitting: Nurse Practitioner

## 2020-09-09 ENCOUNTER — Encounter: Payer: Self-pay | Admitting: Nurse Practitioner

## 2020-09-09 VITALS — BP 124/78 | HR 82 | Temp 98.1°F | Ht 67.6 in | Wt 176.0 lb

## 2020-09-09 DIAGNOSIS — E7800 Pure hypercholesterolemia, unspecified: Secondary | ICD-10-CM | POA: Diagnosis not present

## 2020-09-09 DIAGNOSIS — I10 Essential (primary) hypertension: Secondary | ICD-10-CM | POA: Diagnosis not present

## 2020-09-09 DIAGNOSIS — R945 Abnormal results of liver function studies: Secondary | ICD-10-CM | POA: Diagnosis not present

## 2020-09-09 DIAGNOSIS — R944 Abnormal results of kidney function studies: Secondary | ICD-10-CM

## 2020-09-09 MED ORDER — AMLODIPINE BESYLATE 2.5 MG PO TABS: 2.5000 mg | ORAL_TABLET | Freq: Every day | ORAL | 1 refills | Status: DC

## 2020-09-09 NOTE — Patient Instructions (Signed)

## 2020-09-09 NOTE — Progress Notes (Signed)
I,Yamilka Roman [employer] as a Education administrator for [employer], FNP.,have documented all relevant documentation on the behalf of Minette Brine, FNP,as directed by  Minette Brine, FNP while in the presence of Minette Brine, [place]. This visit occurred during the SARS-CoV-2 public health emergency.  Safety protocols were in place, including screening questions prior to the visit, additional usage of staff PPE, and extensive cleaning of exam room while observing appropriate contact time as indicated for disinfecting solutions.  Subjective:     Patient ID: Todd Montgomery , male    DOB: 1980/08/06 , 40 y.o.   MRN: [id number]   Chief Complaint  Patient presents with  . Hypertension  . Hyperlipidemia    HPI  Patient presents today for a blood pressure f/u.  He went to Urology has an appt next week.  Hypertension This is a chronic problem. The current episode started more than 1 year ago. The problem is controlled. Pertinent negatives include no anxiety, chest pain, headaches, malaise/fatigue, palpitations or shortness of breath. There are no associated agents to hypertension. Risk factors for coronary artery disease include sedentary lifestyle. Past treatments include calcium channel blockers. There are no compliance problems.  There is no history of angina. There is no history of chronic renal disease.     Past Medical History:  Diagnosis Date  . Allergy      Family History  Problem Relation Age of Onset  . Hypertension Mother   . Stroke Mother   . Healthy Sister   . Hypertension Maternal Grandmother      Current Outpatient Medications:  .  amLODipine (NORVASC) 2.5 MG tablet, Take 1 tablet (2.5 mg total) by mouth daily., Disp: 90 tablet, Rfl: 1 .  rosuvastatin (CRESTOR) 10 MG tablet, Take 1 tablet (10 mg total) by mouth daily., Disp: 30 tablet, Rfl: 2   No Known Allergies   Review of Systems  Constitutional: Negative.  Negative for malaise/fatigue.  Respiratory: Negative for shortness of  breath.   Cardiovascular: Negative for chest pain, palpitations and leg swelling.  Musculoskeletal: Negative.   Skin: Negative.   Neurological: Negative.  Negative for headaches.  Psychiatric/Behavioral: Negative.      Today's Vitals   09/09/20 1020  BP: 124/78  Pulse: 82  Temp: 98.1 F (36.7 C)  TempSrc: Oral  Weight: 176 lb (79.8 kg)  Height: 5' 7.6" (1.717 m)  PainSc: 0-No pain   Body mass index is 27.08 kg/m.   Objective:  Physical Exam Vitals reviewed.  Constitutional:      General: He is not in acute distress.    Appearance: Normal appearance.  Cardiovascular:     Rate and Rhythm: Normal rate and regular rhythm.     Pulses: Normal pulses.     Heart sounds: No murmur heard.   Pulmonary:     Effort: Pulmonary effort is normal. No respiratory distress.     Breath sounds: Normal breath sounds.  Skin:    Capillary Refill: Capillary refill takes less than 2 seconds.  Neurological:     General: No focal deficit present.     Mental Status: He is alert and oriented to person, place, and time.     Cranial Nerves: No cranial nerve deficit.     Motor: No weakness.  Psychiatric:        Mood and Affect: Mood normal.        Behavior: Behavior normal.        Thought Content: Thought content normal.        Judgment:  Judgment normal.         Assessment And Plan:     1. Essential hypertension  Chronic, blood pressure is more improved  Continue with current medications - amLODipine (NORVASC) 2.5 MG tablet; Take 1 tablet (2.5 mg total) by mouth daily.  Dispense: 90 tablet; Refill: 1  2. Elevated cholesterol  Chronic, no current medications at this time  Pending results will consider medications for cholesterol lowering  Discussed risk of cardiac events related to elevated cholesterol - CMP14+EGFR - Lipid panel  3. Abnormal liver functions  Will recheck liver functions, not on any medications that could cause this     Patient was given opportunity to ask  questions. Patient verbalized understanding of the plan and was able to repeat key elements of the plan. All questions were answered to their satisfaction.  Minette Brine, FNP   I, Minette Brine, FNP, have reviewed all documentation for this visit. The documentation on 09/11/20 for the exam, diagnosis, procedures, and orders are all accurate and complete.   IF YOU HAVE BEEN REFERRED TO A SPECIALIST, IT MAY TAKE 1-2 WEEKS TO SCHEDULE/PROCESS THE REFERRAL. IF YOU HAVE NOT HEARD FROM US/SPECIALIST IN TWO WEEKS, PLEASE GIVE [place] A CALL AT [phone].   THE PATIENT IS ENCOURAGED TO PRACTICE SOCIAL DISTANCING DUE TO THE COVID-19 PANDEMIC.

## 2020-09-10 LAB — LIPID PANEL
Chol/HDL Ratio: 3.2 ratio (ref 0.0–5.0)
Cholesterol, Total: 253 mg/dL — ABNORMAL HIGH (ref 100–199)
HDL: 78 mg/dL (ref >39)
LDL Chol Calc (NIH): 149 mg/dL — ABNORMAL HIGH (ref 0–99)
Triglycerides: 149 mg/dL (ref 0–149)
VLDL Cholesterol Cal: 26 mg/dL (ref 5–40)

## 2020-09-10 LAB — CMP14+EGFR
ALT: 73 IU/L — ABNORMAL HIGH (ref 0–44)
AST: 68 IU/L — ABNORMAL HIGH (ref 0–40)
Albumin/Globulin Ratio: 1.7 (ref 1.2–2.2)
Albumin: 4.5 g/dL (ref 4.0–5.0)
Alkaline Phosphatase: 71 IU/L (ref 44–121)
BUN/Creatinine Ratio: 10 (ref 9–20)
BUN: 10 mg/dL (ref 6–24)
Bilirubin Total: 0.3 mg/dL (ref 0.0–1.2)
CO2: 22 mmol/L (ref 20–29)
Calcium: 9.3 mg/dL (ref 8.7–10.2)
Chloride: 103 mmol/L (ref 96–106)
Creatinine, Ser: 1.05 mg/dL (ref 0.76–1.27)
Globulin, Total: 2.6 g/dL (ref 1.5–4.5)
Glucose: 80 mg/dL (ref 65–99)
Potassium: 4.1 mmol/L (ref 3.5–5.2)
Sodium: 143 mmol/L (ref 134–144)
Total Protein: 7.1 g/dL (ref 6.0–8.5)
eGFR: 92 mL/min/{1.73_m2} (ref >59)

## 2020-09-11 ENCOUNTER — Other Ambulatory Visit: Payer: Self-pay | Admitting: Nurse Practitioner

## 2020-09-11 DIAGNOSIS — E782 Mixed hyperlipidemia: Secondary | ICD-10-CM

## 2020-09-11 MED ORDER — ROSUVASTATIN CALCIUM 10 MG PO TABS: 10.0000 mg | ORAL_TABLET | Freq: Every day | ORAL | 2 refills | Status: DC

## 2020-09-11 MED FILL — ROSUVASTATIN CALCIUM 10 MG: 10 | 30 days supply | Qty: 30 | Fill #0

## 2020-09-28 ENCOUNTER — Other Ambulatory Visit (HOSPITAL_COMMUNITY): Payer: Self-pay

## 2020-09-28 MED ORDER — TADALAFIL 5 MG PO TABS
5.0000 mg | ORAL_TABLET | ORAL | 11 refills | Status: DC | PRN
  Filled 2020-09-28: qty 30, 7d supply, fill #0
  Filled 2020-10-09: qty 30, 8d supply, fill #0
  Filled 2020-11-26 (×2): qty 30, 8d supply, fill #1

## 2020-10-05 ENCOUNTER — Other Ambulatory Visit (HOSPITAL_COMMUNITY): Payer: Self-pay

## 2020-10-07 ENCOUNTER — Other Ambulatory Visit (HOSPITAL_COMMUNITY): Payer: Self-pay

## 2020-10-09 ENCOUNTER — Other Ambulatory Visit (HOSPITAL_COMMUNITY): Payer: Self-pay

## 2020-11-26 ENCOUNTER — Other Ambulatory Visit (HOSPITAL_COMMUNITY): Payer: Self-pay

## 2020-11-26 ENCOUNTER — Other Ambulatory Visit: Payer: Self-pay

## 2020-11-26 DIAGNOSIS — I10 Essential (primary) hypertension: Secondary | ICD-10-CM

## 2020-11-26 MED ORDER — AMLODIPINE BESYLATE 2.5 MG PO TABS
2.5000 mg | ORAL_TABLET | Freq: Every day | ORAL | 1 refills | Status: DC
  Filled 2020-11-26: qty 90, 90d supply, fill #0
  Filled 2021-03-11: qty 90, 90d supply, fill #1

## 2020-12-10 ENCOUNTER — Ambulatory Visit: Payer: 59 | Admitting: Nurse Practitioner

## 2020-12-16 ENCOUNTER — Ambulatory Visit: Payer: 59 | Admitting: Nurse Practitioner

## 2020-12-24 ENCOUNTER — Other Ambulatory Visit: Payer: Self-pay

## 2020-12-24 ENCOUNTER — Ambulatory Visit (INDEPENDENT_AMBULATORY_CARE_PROVIDER_SITE_OTHER): Payer: 59 | Admitting: Nurse Practitioner

## 2020-12-24 ENCOUNTER — Encounter: Payer: Self-pay | Admitting: Nurse Practitioner

## 2020-12-24 VITALS — BP 124/80 | HR 106 | Temp 98.5°F | Ht 67.2 in | Wt 171.8 lb

## 2020-12-24 DIAGNOSIS — I10 Essential (primary) hypertension: Secondary | ICD-10-CM

## 2020-12-24 DIAGNOSIS — E782 Mixed hyperlipidemia: Secondary | ICD-10-CM

## 2020-12-24 NOTE — Progress Notes (Signed)
I,Yamilka Roman [employer] as a Neurosurgeon for [employer], FNP.,have documented all relevant documentation on the behalf of Arnette Felts, FNP,as directed by  Arnette Felts, FNP while in the presence of Arnette Felts, FNP.  This visit occurred during the SARS-CoV-2 public health emergency.  Safety protocols were in place, including screening questions prior to the visit, additional usage of staff PPE, and extensive cleaning of exam room while observing appropriate contact time as indicated for disinfecting solutions.  Subjective:     Patient ID: Todd Montgomery , male    DOB: [birth year] , 40 y.o.   MRN: [id number]   Chief Complaint  Patient presents with   Hypertension    HPI  Patient presents today for a blood pressure f/u.  Patient is compliant with meds.   Hypertension This is a chronic problem. The current episode started more than 1 year ago. The problem is controlled. Pertinent negatives include no anxiety, chest pain, headaches, malaise/fatigue, palpitations or shortness of breath. There are no associated agents to hypertension. Risk factors for coronary artery disease include sedentary lifestyle. Past treatments include calcium channel blockers. There are no compliance problems.  There is no history of angina. There is no history of chronic renal disease.    Past Medical History:  Diagnosis Date   Allergy      Family History  Problem Relation Age of Onset   Hypertension Mother    Stroke Mother    Healthy Sister    Hypertension Maternal Grandmother      Current Outpatient Medications:    amLODipine (NORVASC) 2.5 MG tablet, Take 1 tablet (2.5 mg total) by mouth daily., Disp: 90 tablet, Rfl: 1   rosuvastatin (CRESTOR) 10 MG tablet, TAKE 1 TABLET (10 MG TOTAL) BY MOUTH DAILY., Disp: 30 tablet, Rfl: 2   tadalafil (CIALIS) 5 MG tablet, Take 1-4 tablets (5-20 mg total) by mouth as needed 1-2 hours before planned activity, Disp: 30 tablet, Rfl: 11   cyclobenzaprine (FLEXERIL) 10 MG  tablet, Take 1 tablet (10 mg total) by mouth 3 (three) times daily as needed for muscle spasms., Disp: 30 tablet, Rfl: 0   naproxen (NAPROSYN) 500 MG tablet, Take 1 tablet (500 mg total) by mouth 2 (two) times daily with a meal., Disp: 60 tablet, Rfl: 2   No Known Allergies   Review of Systems  Constitutional:  Negative for malaise/fatigue.  Respiratory:  Negative for shortness of breath.   Cardiovascular:  Negative for chest pain and palpitations.  Neurological:  Negative for headaches.    Today's Vitals   12/24/20 1652  BP: 124/80  Pulse: (!) 106  Temp: 98.5 F (36.9 C)  Weight: 171 lb 12.8 oz (77.9 kg)  Height: 5' 7.2" (1.707 m)  PainSc: 0-No pain   Body mass index is 26.75 kg/m.   Objective:  Physical Exam Vitals reviewed.  Constitutional:      General: He is not in acute distress.    Appearance: Normal appearance.  Cardiovascular:     Rate and Rhythm: Normal rate and regular rhythm.     Pulses: Normal pulses.     Heart sounds: No murmur heard. Pulmonary:     Effort: Pulmonary effort is normal. No respiratory distress.     Breath sounds: Normal breath sounds. No wheezing.  Skin:    Capillary Refill: Capillary refill takes less than 2 seconds.  Neurological:     General: No focal deficit present.     Mental Status: He is alert and oriented to person, place, and  time.     Cranial Nerves: No cranial nerve deficit.     Motor: No weakness.  Psychiatric:        Mood and Affect: Mood normal.        Behavior: Behavior normal.        Thought Content: Thought content normal.        Judgment: Judgment normal.        Assessment And Plan:     1. Essential hypertension Comments: Blood pressure is much better controlled Continue current medications  2. Mixed hyperlipidemia Comments: Will check lipid panel, continue low fat diet - Lipid panel; Future    Patient was given opportunity to ask questions. Patient verbalized understanding of the plan and was able to  repeat key elements of the plan. All questions were answered to their satisfaction.  Arnette Felts, FNP   I, Arnette Felts, FNP, have reviewed all documentation for this visit. The documentation on 12/24/20 for the exam, diagnosis, procedures, and orders are all accurate and complete.   IF YOU HAVE BEEN REFERRED TO A SPECIALIST, IT MAY TAKE 1-2 WEEKS TO SCHEDULE/PROCESS THE REFERRAL. IF YOU HAVE NOT HEARD FROM US/SPECIALIST IN TWO WEEKS, PLEASE GIVE [place] A CALL AT [phone].   THE PATIENT IS ENCOURAGED TO PRACTICE SOCIAL DISTANCING DUE TO THE COVID-19 PANDEMIC.

## 2020-12-24 NOTE — Patient Instructions (Signed)

## 2020-12-28 ENCOUNTER — Other Ambulatory Visit: Payer: Self-pay

## 2020-12-28 ENCOUNTER — Ambulatory Visit (INDEPENDENT_AMBULATORY_CARE_PROVIDER_SITE_OTHER): Payer: 59 | Admitting: Nurse Practitioner

## 2020-12-28 ENCOUNTER — Other Ambulatory Visit (HOSPITAL_COMMUNITY): Payer: Self-pay

## 2020-12-28 VITALS — BP 142/80 | HR 87 | Temp 98.6°F | Ht 67.2 in | Wt 173.4 lb

## 2020-12-28 DIAGNOSIS — M545 Low back pain, unspecified: Secondary | ICD-10-CM | POA: Diagnosis not present

## 2020-12-28 DIAGNOSIS — R82998 Other abnormal findings in urine: Secondary | ICD-10-CM | POA: Diagnosis not present

## 2020-12-28 DIAGNOSIS — G8929 Other chronic pain: Secondary | ICD-10-CM | POA: Diagnosis not present

## 2020-12-28 LAB — POCT URINALYSIS DIPSTICK
Bilirubin, UA: NEGATIVE
Blood, UA: NEGATIVE
Glucose, UA: NEGATIVE
Ketones, UA: NEGATIVE
Nitrite, UA: NEGATIVE
Protein, UA: POSITIVE — AB
Spec Grav, UA: 1.02 (ref 1.010–1.025)
Urobilinogen, UA: 1 E.U./dL
pH, UA: 7.5 (ref 5.0–8.0)

## 2020-12-28 MED ORDER — NAPROXEN 500 MG PO TABS
500.0000 mg | ORAL_TABLET | Freq: Two times a day (BID) | ORAL | 2 refills | Status: DC
  Filled 2020-12-28: qty 60, 30d supply, fill #0

## 2020-12-28 MED ORDER — CYCLOBENZAPRINE HCL 10 MG PO TABS
10.0000 mg | ORAL_TABLET | Freq: Three times a day (TID) | ORAL | 0 refills | Status: DC | PRN
Start: 2020-12-28 — End: 2021-11-24
  Filled 2020-12-28: qty 30, 10d supply, fill #0

## 2020-12-28 MED ORDER — KETOROLAC TROMETHAMINE 60 MG/2ML IM SOLN
60.0000 mg | Freq: Once | INTRAMUSCULAR | Status: AC
  Administered 2020-12-28: 60 mg via INTRAMUSCULAR

## 2020-12-28 MED ORDER — TRIAMCINOLONE ACETONIDE 40 MG/ML IJ SUSP
40.0000 mg | Freq: Once | INTRAMUSCULAR | Status: AC
  Administered 2020-12-28: 40 mg via INTRAMUSCULAR
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
## 2020-12-28 NOTE — Patient Instructions (Addendum)
Acute Back Pain, Adult Acute back pain is sudden and usually short-lived. It is often caused by an injury to the muscles and tissues in the back. The injury may result from: A muscle or ligament getting overstretched or torn (strained). Ligaments are tissues that connect bones to each other. Lifting something improperly can cause a back strain. Wear and tear (degeneration) of the spinal disks. Spinal disks are circular tissue that provide cushioning between the bones of the spine (vertebrae). Twisting motions, such as while playing sports or doing yard work. A hit to the back. Arthritis. You may have a physical exam, lab tests, and imaging tests to find the cause ofyour pain. Acute back pain usually goes away with rest and home care. Follow these instructions at home: Managing pain, stiffness, and swelling Treatment may include medicines for pain and inflammation that are taken by mouth or applied to the skin, prescription pain medicine, or muscle relaxants. Take over-the-counter and prescription medicines only as told by your health care provider. Your health care provider may recommend applying ice during the first 24-48 hours after your pain starts. To do this: Put ice in a plastic bag. Place a towel between your skin and the bag. Leave the ice on for 20 minutes, 2-3 times a day. If directed, apply heat to the affected area as often as told by your health care provider. Use the heat source that your health care provider recommends, such as a moist heat pack or a heating pad. Place a towel between your skin and the heat source. Leave the heat on for 20-30 minutes. Remove the heat if your skin turns bright red. This is especially important if you are unable to feel pain, heat, or cold. You have a greater risk of getting burned. Activity  Do not stay in bed. Staying in bed for more than 1-2 days can delay your recovery. Sit up and stand up straight. Avoid leaning forward when you sit or  hunching over when you stand. If you work at a desk, sit close to it so you do not need to lean over. Keep your chin tucked in. Keep your neck drawn back, and keep your elbows bent at a 90-degree angle (right angle). Sit high and close to the steering wheel when you drive. Add lower back (lumbar) support to your car seat, if needed. Take short walks on even surfaces as soon as you are able. Try to increase the length of time you walk each day. Do not sit, drive, or stand in one place for more than 30 minutes at a time. Sitting or standing for long periods of time can put stress on your back. Do not drive or use heavy machinery while taking prescription pain medicine. Use proper lifting techniques. When you bend and lift, use positions that put less stress on your back: Bend your knees. Keep the load close to your body. Avoid twisting. Exercise regularly as told by your health care provider. Exercising helps your back heal faster and helps prevent back injuries by keeping muscles strong and flexible. Work with a physical therapist to make a safe exercise program, as recommended by your health care provider. Do any exercises as told by your physical therapist.  Lifestyle Maintain a healthy weight. Extra weight puts stress on your back and makes it difficult to have good posture. Avoid activities or situations that make you feel anxious or stressed. Stress and anxiety increase muscle tension and can make back pain worse. Learn ways to manage   anxiety and stress, such as through exercise. General instructions Sleep on a firm mattress in a comfortable position. Try lying on your side with your knees slightly bent. If you lie on your back, put a pillow under your knees. Follow your treatment plan as told by your health care provider. This may include: Cognitive or behavioral therapy. Acupuncture or massage therapy. Meditation or yoga. Contact a health care provider if: You have pain that is not  relieved with rest or medicine. You have increasing pain going down into your legs or buttocks. Your pain does not improve after 2 weeks. You have pain at night. You lose weight without trying. You have a fever or chills. Get help right away if: You develop new bowel or bladder control problems. You have unusual weakness or numbness in your arms or legs. You develop nausea or vomiting. You develop abdominal pain. You feel faint. Summary Acute back pain is sudden and usually short-lived. Use proper lifting techniques. When you bend and lift, use positions that put less stress on your back. Take over-the-counter and prescription medicines and apply heat or ice as directed by your health care provider. This information is not intended to replace advice given to you by your health care provider. Make sure you discuss any questions you have with your healthcare provider. Document Revised: 02/25/2020 Document Reviewed: 02/28/2020 [employer]vier Patient Education  2022 [employer]  

## 2020-12-30 ENCOUNTER — Other Ambulatory Visit: Payer: 59

## 2020-12-30 ENCOUNTER — Other Ambulatory Visit: Payer: Self-pay

## 2020-12-30 DIAGNOSIS — E782 Mixed hyperlipidemia: Secondary | ICD-10-CM

## 2020-12-30 LAB — URINE CULTURE

## 2020-12-31 LAB — LIPID PANEL
Chol/HDL Ratio: 2.3 ratio (ref 0.0–5.0)
Cholesterol, Total: 253 mg/dL — ABNORMAL HIGH (ref 100–199)
HDL: 110 mg/dL (ref >39)
LDL Chol Calc (NIH): 132 mg/dL — ABNORMAL HIGH (ref 0–99)
Triglycerides: 65 mg/dL (ref 0–149)
VLDL Cholesterol Cal: 11 mg/dL (ref 5–40)

## 2021-01-15 ENCOUNTER — Encounter: Payer: Self-pay | Admitting: Nurse Practitioner

## 2021-03-11 ENCOUNTER — Other Ambulatory Visit (HOSPITAL_COMMUNITY): Payer: Self-pay

## 2021-03-12 ENCOUNTER — Other Ambulatory Visit (HOSPITAL_COMMUNITY): Payer: Self-pay

## 2021-03-15 ENCOUNTER — Other Ambulatory Visit (HOSPITAL_COMMUNITY): Payer: Self-pay

## 2021-03-15 MED ORDER — AMOXICILLIN 500 MG PO CAPS
500.0000 mg | ORAL_CAPSULE | Freq: Three times a day (TID) | ORAL | 0 refills | Status: DC
  Filled 2021-03-15: qty 18, 6d supply, fill #0

## 2021-03-15 MED ORDER — TRAMADOL HCL 50 MG PO TABS
50.0000 mg | ORAL_TABLET | Freq: Four times a day (QID) | ORAL | 0 refills | Status: DC
  Filled 2021-03-15: qty 10, 3d supply, fill #0

## 2021-05-26 ENCOUNTER — Other Ambulatory Visit: Payer: Self-pay

## 2021-05-26 ENCOUNTER — Encounter: Payer: Self-pay | Admitting: Nurse Practitioner

## 2021-05-26 ENCOUNTER — Other Ambulatory Visit (HOSPITAL_COMMUNITY): Payer: Self-pay

## 2021-05-26 ENCOUNTER — Ambulatory Visit (INDEPENDENT_AMBULATORY_CARE_PROVIDER_SITE_OTHER): Payer: 59 | Admitting: Nurse Practitioner

## 2021-05-26 VITALS — BP 122/88 | HR 84 | Temp 98.1°F | Ht 67.0 in | Wt 172.4 lb

## 2021-05-26 DIAGNOSIS — Z0001 Encounter for general adult medical examination with abnormal findings: Secondary | ICD-10-CM | POA: Diagnosis not present

## 2021-05-26 DIAGNOSIS — Z0000 Encounter for general adult medical examination without abnormal findings: Secondary | ICD-10-CM

## 2021-05-26 DIAGNOSIS — Z23 Encounter for immunization: Secondary | ICD-10-CM | POA: Diagnosis not present

## 2021-05-26 DIAGNOSIS — R0981 Nasal congestion: Secondary | ICD-10-CM | POA: Diagnosis not present

## 2021-05-26 DIAGNOSIS — I10 Essential (primary) hypertension: Secondary | ICD-10-CM | POA: Diagnosis not present

## 2021-05-26 DIAGNOSIS — R7309 Other abnormal glucose: Secondary | ICD-10-CM

## 2021-05-26 DIAGNOSIS — E782 Mixed hyperlipidemia: Secondary | ICD-10-CM | POA: Diagnosis not present

## 2021-05-26 MED ORDER — FLUTICASONE PROPIONATE 50 MCG/ACT NA SUSP
2.0000 | Freq: Every day | NASAL | 2 refills | Status: DC
  Filled 2021-05-26 – 2022-01-05 (×2): qty 16, 30d supply, fill #0

## 2021-05-26 NOTE — Patient Instructions (Signed)

## 2021-05-26 NOTE — Progress Notes (Signed)
I,Victoria T Hamilton,acting as a Education administrator for Minette Brine, FNP.,have documented all relevant documentation on the behalf of Minette Brine, FNP,as directed by  Minette Brine, FNP while in the presence of Minette Brine, [place].   This visit occurred during the SARS-CoV-2 public health emergency.  Safety protocols were in place, including screening questions prior to the visit, additional usage of staff PPE, and extensive cleaning of exam room while observing appropriate contact time as indicated for disinfecting solutions.  Subjective:     Patient ID: Todd Montgomery , male    DOB: July 27, 1980 , 40 y.o.   MRN: [id number]   Chief Complaint  Patient presents with   Annual Exam    HPI  Patient here for hm. He had been going to the Urologist, has not seenhim in a while.     Past Medical History:  Diagnosis Date   Allergy      Family History  Problem Relation Age of Onset   Hypertension Mother    Stroke Mother    Healthy Sister    Hypertension Maternal Grandmother      Current Outpatient Medications:    amLODipine (NORVASC) 2.5 MG tablet, Take 1 tablet (2.5 mg total) by mouth daily., Disp: 90 tablet, Rfl: 1   fluticasone (FLONASE) 50 MCG/ACT nasal spray, Place 2 sprays into both nostrils daily., Disp: 16 g, Rfl: 2   cyclobenzaprine (FLEXERIL) 10 MG tablet, Take 1 tablet (10 mg total) by mouth 3 (three) times daily as needed for muscle spasms. (Patient not taking: Reported on 05/26/2021), Disp: 30 tablet, Rfl: 0   naproxen (NAPROSYN) 500 MG tablet, Take 1 tablet (500 mg total) by mouth 2 (two) times daily with a meal. (Patient not taking: Reported on 05/26/2021), Disp: 60 tablet, Rfl: 2   rosuvastatin (CRESTOR) 10 MG tablet, TAKE 1 TABLET (10 MG TOTAL) BY MOUTH DAILY. (Patient not taking: Reported on 05/26/2021), Disp: 30 tablet, Rfl: 2   tadalafil (CIALIS) 5 MG tablet, Take 1-4 tablets (5-20 mg total) by mouth as needed 1-2 hours before planned activity (Patient not taking: Reported on 05/26/2021),  Disp: 30 tablet, Rfl: 11   traMADol (ULTRAM) 50 MG tablet, Take 1 tablet (50 mg total) by mouth every 6 (six) hours. (Patient not taking: Reported on 05/26/2021), Disp: 10 tablet, Rfl: 0   No Known Allergies   Men's preventive visit. Patient Health Questionnaire (PHQ-2) is  [place] Office Visit from 09/09/2020 in [facility]  PHQ-2 Total Score 0      Patient is on a regular diet. Exercising - walks a lot at work.  Marital status: Single. Relevant history for alcohol use is:  Social History   Substance and Sexual Activity  Alcohol Use Yes   Relevant history for tobacco use is:  Social History   Tobacco Use  Smoking Status Every Day   Packs/day: 0.50   Years: 15.00   Pack years: 7.50   Types: Cigarettes  Smokeless Tobacco Never  .   Review of Systems  Constitutional: Negative.   HENT: Negative.    Eyes: Negative.   Respiratory: Negative.    Cardiovascular: Negative.   Gastrointestinal: Negative.   Endocrine: Negative.   Genitourinary: Negative.   Musculoskeletal: Negative.   Skin: Negative.   Neurological: Negative.  Negative for dizziness and headaches.  Hematological: Negative.   Psychiatric/Behavioral: Negative.      Today's Vitals   05/26/21 1505  BP: 122/88  Pulse: 84  Temp: 98.1 F (36.7 C)  Weight: 172 lb 6.4  oz (78.2 kg)  Height: 5' 7"  (1.702 m)  PainSc: 0-No pain   Body mass index is 27 kg/m.  Wt Readings from Last 3 Encounters:  05/26/21 172 lb 6.4 oz (78.2 kg)  12/28/20 173 lb 6.4 oz (78.7 kg)  12/24/20 171 lb 12.8 oz (77.9 kg)    Objective:  Physical Exam Vitals reviewed.  Constitutional:      General: He is not in acute distress.    Appearance: Normal appearance.  HENT:     Head: Normocephalic and atraumatic.     Right Ear: Tympanic membrane, ear canal and external ear normal. There is no impacted cerumen.     Left Ear: Tympanic membrane, ear canal and external ear normal. There is no impacted cerumen.      Nose:     Comments: Deferred - masked    Mouth/Throat:     Comments: Deferred - masked Eyes:     Pupils: Pupils are equal, round, and reactive to light.  Cardiovascular:     Rate and Rhythm: Normal rate and regular rhythm.     Pulses: Normal pulses.     Heart sounds: Normal heart sounds. No murmur heard. Pulmonary:     Effort: Pulmonary effort is normal. No respiratory distress.     Breath sounds: Normal breath sounds. No wheezing.  Abdominal:     General: Abdomen is flat. Bowel sounds are normal. There is no distension.     Palpations: Abdomen is soft. There is no mass.     Tenderness: There is no abdominal tenderness.  Genitourinary:    Comments: Deferred - has Urologist Musculoskeletal:        General: No swelling or tenderness. Normal range of motion.     Cervical back: Normal range of motion and neck supple.  Skin:    General: Skin is warm and dry.     Capillary Refill: Capillary refill takes less than 2 seconds.     Findings: No bruising.  Neurological:     General: No focal deficit present.     Mental Status: He is alert and oriented to person, place, and time.     Cranial Nerves: No cranial nerve deficit.     Motor: No weakness.  Psychiatric:        Mood and Affect: Mood normal.        Behavior: Behavior normal.        Thought Content: Thought content normal.        Judgment: Judgment normal.        Assessment And Plan:    1. Encounter for general adult medical examination w/o abnormal findings Behavior modifications discussed and diet history reviewed.   Pt will continue to exercise regularly and modify diet with low GI, plant based foods and decrease intake of processed foods.  Recommend intake of daily multivitamin, Vitamin D, and calcium.  Recommend for preventive screenings, as well as recommend immunizations that include influenza, TDAP (up to date)  2. Immunization due Influenza vaccine administered Encouraged to take Tylenol as needed for fever or  muscle aches. - Flu Vaccine QUAD 6+ mos PF IM (Fluarix Quad PF)  3. Essential hypertension Comments: Blood pressure is fairly controlled, discussed his diastolic over 80 and encouraged to continue to eat a low salt diet and stay well hydrated.  - CMP14+EGFR - EKG 12-Lead  4. Mixed hyperlipidemia Comments: Slight improvement, tolerating statin well.  - Lipid panel  5. Abnormal glucose Comments: No current medications, encouraged to avoid junk food, sugary  and high carbohydrate type foods.  - Hemoglobin A1c  6. Nasal congestion Comments: Recently had what he thought to be the flu, not tested but family member affected. Will provide steroid nasal spray     Patient was given opportunity to ask questions. Patient verbalized understanding of the plan and was able to repeat key elements of the plan. All questions were answered to their satisfaction.   Minette Brine, FNP   I, Minette Brine, FNP, have reviewed all documentation for this visit. The documentation on 05/30/21 for the exam, diagnosis, procedures, and orders are all accurate and c07plete.   THE PATIENT IS ENCOURAGED TO PRACTICE SOCIAL DISTANCING DUE TO THE COVID-19 PANDEMIC.

## 2021-05-27 ENCOUNTER — Other Ambulatory Visit (HOSPITAL_COMMUNITY): Payer: Self-pay

## 2021-06-04 ENCOUNTER — Other Ambulatory Visit (HOSPITAL_COMMUNITY): Payer: Self-pay

## 2021-10-04 ENCOUNTER — Other Ambulatory Visit (HOSPITAL_COMMUNITY): Payer: Self-pay

## 2021-10-04 ENCOUNTER — Other Ambulatory Visit: Payer: Self-pay

## 2021-10-04 DIAGNOSIS — I10 Essential (primary) hypertension: Secondary | ICD-10-CM

## 2021-10-04 MED ORDER — AMLODIPINE BESYLATE 2.5 MG PO TABS
2.5000 mg | ORAL_TABLET | Freq: Every day | ORAL | 1 refills | Status: DC
  Filled 2021-10-04: qty 90, 90d supply, fill #0
  Filled 2022-01-05: qty 90, 90d supply, fill #1

## 2021-10-25 ENCOUNTER — Emergency Department (HOSPITAL_COMMUNITY)
Admission: EM | Admit: 2021-10-25 | Discharge: 2021-10-25 | Disposition: A | Discharge status: Home / Self Care | Payer: Commercial Managed Care - HMO | Attending: Emergency Medicine | Admitting: Emergency Medicine

## 2021-10-25 ENCOUNTER — Encounter (HOSPITAL_COMMUNITY): Payer: Self-pay | Admitting: Emergency Medicine

## 2021-10-25 ENCOUNTER — Other Ambulatory Visit: Payer: Self-pay

## 2021-10-25 ENCOUNTER — Emergency Department (HOSPITAL_COMMUNITY): Payer: Commercial Managed Care - HMO

## 2021-10-25 DIAGNOSIS — S62652A Nondisplaced fracture of medial phalanx of right middle finger, initial encounter for closed fracture: Secondary | ICD-10-CM | POA: Insufficient documentation

## 2021-10-25 DIAGNOSIS — W230XXA Caught, crushed, jammed, or pinched between moving objects, initial encounter: Secondary | ICD-10-CM | POA: Diagnosis not present

## 2021-10-25 DIAGNOSIS — Y9367 Activity, basketball: Secondary | ICD-10-CM | POA: Diagnosis not present

## 2021-10-25 DIAGNOSIS — S6991XA Unspecified injury of right wrist, hand and finger(s), initial encounter: Secondary | ICD-10-CM | POA: Diagnosis present

## 2021-10-25 MED ORDER — HYDROCODONE-ACETAMINOPHEN 5-325 MG PO TABS
1.0000 | ORAL_TABLET | Freq: Once | ORAL | Status: AC
  Administered 2021-10-25: 1 via ORAL
  Filled 2021-10-25: qty 1

## 2021-10-25 NOTE — ED Triage Notes (Signed)
Pt reports playing basketball yesterday and reports jamming right middle finger on basketball. Pt reports swelling to finger.  ?

## 2021-10-25 NOTE — ED Provider Notes (Signed)
?Todd Montgomery ?Provider Note ? ? ?CSN: [id number] ?Arrival date & time: 10/25/21  1116 ? ?  ? ?History ?Chief Complaint  ?Patient presents with  ? Finger Injury  ? ? ?Todd Montgomery is a 41 y.o. male who presents to the emergency department with right third finger pain after jamming it playing basketball roughly a week ago.  He reports associated swelling to the area.  No further injury or trauma.  Also complaining of pain over the right fourth finger as well but less severe in comparison to the third. ? ?HPI ? ?  ? ?Home Medications ?Prior to Admission medications   ?Medication Sig Start Date End Date Taking? Authorizing Provider  ?amLODipine (NORVASC) 2.5 MG tablet Take 1 tablet (2.5 mg total) by mouth daily. 10/04/21   Arnette Felts, FNP  ?cyclobenzaprine (FLEXERIL) 10 MG tablet Take 1 tablet (10 mg total) by mouth 3 (three) times daily as needed for muscle spasms. ?Patient not taking: Reported on 05/26/2021 12/28/20   Arnette Felts, FNP  ?fluticasone (FLONASE) 50 MCG/ACT nasal spray Place 2 sprays into both nostrils daily. 05/26/21   Arnette Felts, FNP  ?naproxen (NAPROSYN) 500 MG tablet Take 1 tablet (500 mg total) by mouth 2 (two) times daily with a meal. ?Patient not taking: Reported on 05/26/2021 12/28/20   Arnette Felts, FNP  ?rosuvastatin (CRESTOR) 10 MG tablet TAKE 1 TABLET (10 MG TOTAL) BY MOUTH DAILY. ?Patient not taking: Reported on 05/26/2021 09/11/20 09/11/21  Arnette Felts, FNP  ?tadalafil (CIALIS) 5 MG tablet Take 1-4 tablets (5-20 mg total) by mouth as needed 1-2 hours before planned activity ?Patient not taking: Reported on 05/26/2021 09/28/20     ?traMADol (ULTRAM) 50 MG tablet Take 1 tablet (50 mg total) by mouth every 6 (six) hours. ?Patient not taking: Reported on 05/26/2021 03/15/21     ?   ? ?Allergies    ?Patient has no known allergies.   ? ?Review of Systems   ?Review of Systems  ?All other systems reviewed and are negative. ? ?Physical Exam ?Updated Vital Signs ?BP (!)  158/108   Pulse 82   Temp 98.4 ?F (36.9 ?C) (Oral)   Resp 18   SpO2 100%  ?Physical Exam ?Vitals and nursing note reviewed.  ?Constitutional:   ?   Appearance: Normal appearance.  ?HENT:  ?   Head: Normocephalic and atraumatic.  ?Eyes:  ?   General:     ?   Right eye: No discharge.     ?   Left eye: No discharge.  ?   Conjunctiva/sclera: Conjunctivae normal.  ?Pulmonary:  ?   Effort: Pulmonary effort is normal.  ?Musculoskeletal:  ?   Comments: Right third finger is swollen at the PIP.  Intact sensation and good cap refill distally.  Some decreased range of motion secondary to pain.  ?Skin: ?   General: Skin is warm and dry.  ?   Findings: No rash.  ?Neurological:  ?   General: No focal deficit present.  ?   Mental Status: He is alert.  ?Psychiatric:     ?   Mood and Affect: Mood normal.     ?   Behavior: Behavior normal.  ? ? ?ED Results / Procedures / Treatments   ?Labs ?(all labs ordered are listed, but only abnormal results are displayed) ?Labs Reviewed - No data to display ? ?EKG ?None ? ?Radiology ?No results found. ? ?Procedures ?Procedures  ? ? ?Medications Ordered in ED ?Medications  ?HYDROcodone-acetaminophen (NORCO/VICODIN) 5-325  MG per tablet 1 tablet (1 tablet Oral Given 10/25/21 1259)  ? ? ?ED Course/ Medical Decision Making/ A&P ?  ?                        ?Medical Decision Making ?Amount and/or Complexity of Data Reviewed ?Radiology: ordered. ? ?Risk ?Prescription drug management. ? ? ?Todd Montgomery is a 41 y.o. male who presents the emergency department with right third and fourth finger pain after playing basketball and jamming his fingers.  Patient has good sensation distally.  X-ray shows intra-articular fracture of the right PIP and finger.  Concerned that this is a week old injury I will put in a static splint and have him follow-up with hand surgery for further evaluation.  Strict turn precautions were discussed.  He is safe for discharge. ? ? ?Final Clinical Impression(s) / ED  Diagnoses ?Final diagnoses:  ?Closed nondisplaced fracture of middle phalanx of right middle finger, initial encounter  ? ? ?Rx / DC Orders ?ED Discharge Orders   ? ? None  ? ?  ? ? ?  ?Myna Bright Banc[place], PA-C ?10/29/21 [id number] ? ?  ?Godfrey Pick, MD ?10/30/21 1058 ? ?

## 2021-10-25 NOTE — Discharge Instructions (Addendum)
Please follow-up with hand surgery for further evaluation.  Return to the emergency department for any worsening symptoms.  You can take 600 mg ibuprofen every 6 hours. ?

## 2021-10-25 NOTE — Progress Notes (Signed)
Orthopedic Tech Progress Note ?Patient Details:  ?Todd Montgomery ?1980-10-14 ?[id number] ? ?Patient ID: Todd Montgomery, male   DOB: April 18, 1981, 41 y.o.   MRN: [id number] ? ?Todd Montgomery ?10/25/2021, 1:42 PMNurse applied finger splint. Charging for Ortho supplies.  Finger splint and wrap ? ?

## 2021-10-29 ENCOUNTER — Telehealth: Payer: Self-pay

## 2021-10-29 NOTE — Telephone Encounter (Signed)
Transition Care Management Follow-up Telephone Call ?Date of discharge and from where: 10/25/2021 [place] ?How have you been since you were released from the hospital? Pt states he is fine, he just broke his finger which is doing good now. He does not feel the need of an appointment right now. ?Any questions or concerns? No ? ?Items Reviewed: ?Did the pt receive and understand the discharge instructions provided? Yes  ?Medications obtained and verified? Yes  ?Other? No  ?Any new allergies since your discharge? No  ?Dietary orders reviewed? Yes ?Do you have support at home? Yes  ? ?Home Care and Equipment/Supplies: ?Were home health services ordered? no ?If so, what is the name of the agency? N/a  ?Has the agency set up a time to come to the patient's home? no ?Were any new equipment or medical supplies ordered?  No ?What is the name of the medical supply agency? N/a ?Were you able to get the supplies/equipment? no ?Do you have any questions related to the use of the equipment or supplies? No ? ?Functional Questionnaire: (I = Independent and D = Dependent) ?ADLs: i ? ?Bathing/Dressing- i ? ?Meal Prep- i ? ?Eating- i ? ?Maintaining continence- i ? ?Transferring/Ambulation- i ? ?Managing Meds- i ? ?Follow up appointments reviewed: ? ?PCP Hospital f/u appt confirmed? No  Scheduled to see n/a on n/a @ n/a. ?Specialist Hospital f/u appt confirmed? No  Scheduled to see n/a on n/a @ n/a. ?Are transportation arrangements needed? No  ?If their condition worsens, is the pt aware to call PCP or go to the Emergency Dept.? Yes ?Was the patient provided with contact information for the PCP's office or ED? Yes ?Was to pt encouraged to call back with questions or concerns? Yes  ?

## 2021-11-24 ENCOUNTER — Encounter: Payer: Self-pay | Admitting: Nurse Practitioner

## 2021-11-24 ENCOUNTER — Ambulatory Visit (INDEPENDENT_AMBULATORY_CARE_PROVIDER_SITE_OTHER): Payer: Commercial Managed Care - HMO | Admitting: Nurse Practitioner

## 2021-11-24 VITALS — BP 128/74 | HR 97 | Temp 98.7°F | Ht 67.0 in | Wt 172.0 lb

## 2021-11-24 DIAGNOSIS — R7309 Other abnormal glucose: Secondary | ICD-10-CM | POA: Diagnosis not present

## 2021-11-24 DIAGNOSIS — S62652G Nondisplaced fracture of medial phalanx of right middle finger, subsequent encounter for fracture with delayed healing: Secondary | ICD-10-CM

## 2021-11-24 DIAGNOSIS — E782 Mixed hyperlipidemia: Secondary | ICD-10-CM

## 2021-11-24 DIAGNOSIS — I10 Essential (primary) hypertension: Secondary | ICD-10-CM

## 2021-11-24 NOTE — Progress Notes (Signed)
I,Tianna Badgett,acting as a Education administrator for [employer], FNP.,have documented all relevant documentation on the behalf of Minette Brine, FNP,as directed by  Minette Brine, FNP while in the presence of Minette Brine, [place].  This visit occurred during the SARS-CoV-2 public health emergency.  Safety protocols were in place, including screening questions prior to the visit, additional usage of staff PPE, and extensive cleaning of exam room while observing appropriate contact time as indicated for disinfecting solutions.  Subjective:     Patient ID: Todd Montgomery , male    DOB: October 04, 1980 , 41 y.o.   MRN: [id number]   Chief Complaint  Patient presents with   Hypertension    HPI  Patient presents today for a blood pressure f/u.    Hypertension This is a chronic problem. The current episode started more than 1 year ago. The problem is controlled. Pertinent negatives include no anxiety, chest pain, headaches, malaise/fatigue, palpitations or shortness of breath. There are no associated agents to hypertension. Risk factors for coronary artery disease include sedentary lifestyle. Past treatments include calcium channel blockers. There are no compliance problems.  There is no history of angina. There is no history of chronic renal disease.    Past Medical History:  Diagnosis Date   Allergy      Family History  Problem Relation Age of Onset   Hypertension Mother    Stroke Mother    Healthy Sister    Hypertension Maternal Grandmother      Current Outpatient Medications:    amLODipine (NORVASC) 2.5 MG tablet, Take 1 tablet (2.5 mg total) by mouth daily., Disp: 90 tablet, Rfl: 1   fluticasone (FLONASE) 50 MCG/ACT nasal spray, Place 2 sprays into both nostrils daily., Disp: 16 g, Rfl: 2   No Known Allergies   Review of Systems  Constitutional: Negative.  Negative for malaise/fatigue.  HENT: Negative.    Eyes: Negative.   Respiratory: Negative.  Negative for shortness of breath.    Cardiovascular: Negative.  Negative for chest pain and palpitations.  Gastrointestinal: Negative.   Endocrine: Negative.   Genitourinary: Negative.   Musculoskeletal: Negative.   Skin: Negative.   Allergic/Immunologic: Negative.   Neurological: Negative.  Negative for headaches.  Hematological: Negative.   Psychiatric/Behavioral: Negative.      Today's Vitals   11/24/21 1451  BP: 128/74  Pulse: 97  Temp: 98.7 F (37.1 C)  TempSrc: Oral  Weight: 172 lb (78 kg)  Height: 5' 7"  (1.702 m)   Body mass index is 26.94 kg/m.  Wt Readings from Last 3 Encounters:  11/24/21 172 lb (78 kg)  05/26/21 172 lb 6.4 oz (78.2 kg)  12/28/20 173 lb 6.4 oz (78.7 kg)    Objective:  Physical Exam Vitals reviewed.  Constitutional:      General: He is not in acute distress.    Appearance: Normal appearance.  Cardiovascular:     Rate and Rhythm: Normal rate and regular rhythm.     Pulses: Normal pulses.     Heart sounds: No murmur heard. Pulmonary:     Effort: Pulmonary effort is normal. No respiratory distress.     Breath sounds: Normal breath sounds. No wheezing.  Skin:    Capillary Refill: Capillary refill takes less than 2 seconds.  Neurological:     General: No focal deficit present.     Mental Status: He is alert and oriented to person, place, and time.     Cranial Nerves: No cranial nerve deficit.     Motor: No weakness.  Psychiatric:        Mood and Affect: Mood normal.        Behavior: Behavior normal.        Thought Content: Thought content normal.        Judgment: Judgment normal.        Assessment And Plan:     1. Essential hypertension Comments: Blood pressure is normal, continue current medications and if continues to be within normal limits will consider discontinuing medications.  - BMP8+EGFR  2. Mixed hyperlipidemia Comments: Cholesterol levels have been elevated will likely need to start a statin, discussed risk of heart disease.  - Lipid panel -  BMP8+EGFR  3. Abnormal glucose - Hemoglobin A1c  4. Closed nondisplaced fracture of middle phalanx of right middle finger with delayed healing, subsequent encounter Comments: Still has a splint to his right middle finger and concerned about still having swelling, he was to f/u with Dr. Wynetta Fines, number given to schedule appt. Referral placed to Hand Surgery in case he needs a referral for his insurance - Ambulatory referral to Hand Surgery     Patient was given opportunity to ask questions. Patient verbalized understanding of the plan and was able to repeat key elements of the plan. All questions were answered to their satisfaction.  Minette Brine, FNP   I, Minette Brine, FNP, have reviewed all documentation for this visit. The documentation on 11/24/21 for the exam, diagnosis, procedures, and orders are all accurate and complete.   IF YOU HAVE BEEN REFERRED TO A SPECIALIST, IT MAY TAKE 1-2 WEEKS TO SCHEDULE/PROCESS THE REFERRAL. IF YOU HAVE NOT HEARD FROM US/SPECIALIST IN TWO WEEKS, PLEASE GIVE [place] A CALL AT [phone] X [phone].   THE PATIENT IS ENCOURAGED TO PRACTICE SOCIAL DISTANCING DUE TO THE COVID-19 PANDEMIC.

## 2021-11-24 NOTE — Patient Instructions (Addendum)
Hypertension, Adult High blood pressure (hypertension) is when the force of blood pumping through the arteries is too strong. The arteries are the blood vessels that carry blood from the heart throughout the body. Hypertension forces the heart to work harder to pump blood and may cause arteries to become narrow or stiff. Untreated or uncontrolled hypertension can lead to a heart attack, heart failure, a stroke, kidney disease, and other problems. A blood pressure reading consists of a higher number over a lower number. Ideally, your blood pressure should be below 120/80. The first ("top") number is called the systolic pressure. It is a measure of the pressure in your arteries as your heart beats. The second ("bottom") number is called the diastolic pressure. It is a measure of the pressure in your arteries as the heart relaxes. What are the causes? The exact cause of this condition is not known. There are some conditions that result in high blood pressure. What increases the risk? Certain factors may make you more likely to develop high blood pressure. Some of these risk factors are under your control, including: Smoking. Not getting enough exercise or physical activity. Being overweight. Having too much fat, sugar, calories, or salt (sodium) in your diet. Drinking too much alcohol. Other risk factors include: Having a personal history of heart disease, diabetes, high cholesterol, or kidney disease. Stress. Having a family history of high blood pressure and high cholesterol. Having obstructive sleep apnea. Age. The risk increases with age. What are the signs or symptoms? High blood pressure may not cause symptoms. Very high blood pressure (hypertensive crisis) may cause: Headache. Fast or irregular heartbeats (palpitations). Shortness of breath. Nosebleed. Nausea and vomiting. Vision changes. Severe chest pain, dizziness, and seizures. How is this diagnosed? This condition is diagnosed by  measuring your blood pressure while you are seated, with your arm resting on a flat surface, your legs uncrossed, and your feet flat on the floor. The cuff of the blood pressure monitor will be placed directly against the skin of your upper arm at the level of your heart. Blood pressure should be measured at least twice using the same arm. Certain conditions can cause a difference in blood pressure between your right and left arms. If you have a high blood pressure reading during one visit or you have normal blood pressure with other risk factors, you may be asked to: Return on a different day to have your blood pressure checked again. Monitor your blood pressure at home for 1 week or longer. If you are diagnosed with hypertension, you may have other blood or imaging tests to help your health care provider understand your overall risk for other conditions. How is this treated? This condition is treated by making healthy lifestyle changes, such as eating healthy foods, exercising more, and reducing your alcohol intake. You may be referred for counseling on a healthy diet and physical activity. Your health care provider may prescribe medicine if lifestyle changes are not enough to get your blood pressure under control and if: Your systolic blood pressure is above 130. Your diastolic blood pressure is above 80. Your personal target blood pressure may vary depending on your medical conditions, your age, and other factors. Follow these instructions at home: Eating and drinking  Eat a diet that is high in fiber and potassium, and low in sodium, added sugar, and fat. An example of this eating plan is called the DASH diet. DASH stands for Dietary Approaches to Stop Hypertension. To eat this way: Eat   plenty of fresh fruits and vegetables. Try to fill one half of your plate at each meal with fruits and vegetables. Eat whole grains, such as whole-wheat pasta, brown rice, or whole-grain bread. Fill about one  fourth of your plate with whole grains. Eat or drink low-fat dairy products, such as skim milk or low-fat yogurt. Avoid fatty cuts of meat, processed or cured meats, and poultry with skin. Fill about one fourth of your plate with lean proteins, such as fish, chicken without skin, beans, eggs, or tofu. Avoid pre-made and processed foods. These tend to be higher in sodium, added sugar, and fat. Reduce your daily sodium intake. Many people with hypertension should eat less than 1,500 mg of sodium a day. Do not drink alcohol if: Your health care provider tells you not to drink. You are pregnant, may be pregnant, or are planning to become pregnant. If you drink alcohol: Limit how much you have to: 0-1 drink a day for women. 0-2 drinks a day for men. Know how much alcohol is in your drink. In the [place] one drink equals one 12 oz bottle of beer (355 mL), one 5 oz glass of wine (148 mL), or one 1 oz glass of hard liquor (44 mL). Lifestyle  Work with your health care provider to maintain a healthy body weight or to lose weight. Ask what an ideal weight is for you. Get at least 30 minutes of exercise that causes your heart to beat faster (aerobic exercise) most days of the week. Activities may include walking, swimming, or biking. Include exercise to strengthen your muscles (resistance exercise), such as Pilates or lifting weights, as part of your weekly exercise routine. Try to do these types of exercises for 30 minutes at least 3 days a week. Do not use any products that contain nicotine or tobacco. These products include cigarettes, chewing tobacco, and vaping devices, such as e-cigarettes. If you need help quitting, ask your health care provider. Monitor your blood pressure at home as told by your health care provider. Keep all follow-up visits. This is important. Medicines Take over-the-counter and prescription medicines only as told by your health care provider. Follow directions carefully. Blood  pressure medicines must be taken as prescribed. Do not skip doses of blood pressure medicine. Doing this puts you at risk for problems and can make the medicine less effective. Ask your health care provider about side effects or reactions to medicines that you should watch for. Contact a health care provider if you: Think you are having a reaction to a medicine you are taking. Have headaches that keep coming back (recurring). Feel dizzy. Have swelling in your ankles. Have trouble with your vision. Get help right away if you: Develop a severe headache or confusion. Have unusual weakness or numbness. Feel faint. Have severe pain in your chest or abdomen. Vomit repeatedly. Have trouble breathing. These symptoms may be an emergency. Get help right away. Call 911. Do not wait to see if the symptoms will go away. Do not drive yourself to the hospital. Summary Hypertension is when the force of blood pumping through your arteries is too strong. If this condition is not controlled, it may put you at risk for serious complications. Your personal target blood pressure may vary depending on your medical conditions, your age, and other factors. For most people, a normal blood pressure is less than 120/80. Hypertension is treated with lifestyle changes, medicines, or a combination of both. Lifestyle changes include losing weight, eating a healthy,   low-sodium diet, exercising more, and limiting alcohol. This information is not intended to replace advice given to you by your health care provider. Make sure you discuss any questions you have with your health care provider. Document Revised: 04/13/2021 Document Reviewed: 04/13/2021 Elsevier Patient Education  2023 Elsevier Inc.   Dyslipidemia Dyslipidemia is an imbalance of waxy, fat-like substances (lipids) in the blood. The body needs lipids in small amounts. Dyslipidemia often involves a high level of cholesterol or triglycerides, which are types of  lipids. Common forms of dyslipidemia include: High levels of LDL cholesterol. LDL is the type of cholesterol that causes fatty deposits (plaques) to build up in the blood vessels that carry blood away from the heart (arteries). Low levels of HDL cholesterol. HDL cholesterol is the type of cholesterol that protects against heart disease. High levels of HDL remove the LDL buildup from arteries. High levels of triglycerides. Triglycerides are a fatty substance in the blood that is linked to a buildup of plaques in the arteries. What are the causes? There are two main types of dyslipidemia: primary and secondary. Primary dyslipidemia is caused by changes (mutations) in genes that are passed down through families (inherited). These mutations cause several types of dyslipidemia. Secondary dyslipidemia may be caused by various risk factors that can lead to the disease, such as lifestyle choices and certain medical conditions. What increases the risk? You are more likely to develop this condition if you are an older man or if you are a woman who has gone through menopause. Other risk factors include: Having a family history of dyslipidemia. Taking certain medicines, including birth control pills, steroids, some diuretics, and beta-blockers. Eating a diet high in saturated fat. Smoking cigarettes or excessive alcohol intake. Having certain medical conditions such as diabetes, polycystic ovary syndrome (PCOS), kidney disease, liver disease, or hypothyroidism. Not exercising regularly. Being overweight or obese with too much belly fat. What are the signs or symptoms? In most cases, dyslipidemia does not usually cause any symptoms. In severe cases, very high lipid levels can cause: Fatty bumps under the skin (xanthomas). A white or gray ring around the black center (pupil) of the eye. Very high triglyceride levels can cause inflammation of the pancreas (pancreatitis). How is this diagnosed? Your health  care provider may diagnose dyslipidemia based on a routine blood test (fasting blood test). Because most people do not have symptoms of the condition, this blood testing (lipid profile) is done on adults age 44 and older and is repeated every 4-6 years. This test checks: Total cholesterol. This measures the total amount of cholesterol in your blood, including LDL cholesterol, HDL cholesterol, and triglycerides. A healthy number is below 200 mg/dL (4.85 mmol/L). LDL cholesterol. The target number for LDL cholesterol is different for each person, depending on individual risk factors. A healthy number is usually below 100 mg/dL (4.62 mmol/L). Ask your health care provider what your LDL cholesterol should be. HDL cholesterol. An HDL level of 60 mg/dL (7.03 mmol/L) or higher is best because it helps to protect against heart disease. A number below 40 mg/dL (5.00 mmol/L) for men or below 50 mg/dL (9.38 mmol/L) for women increases the risk for heart disease. Triglycerides. A healthy triglyceride number is below 150 mg/dL (1.82 mmol/L). If your lipid profile is abnormal, your health care provider may do other blood tests. How is this treated? Treatment depends on the type of dyslipidemia that you have and your other risk factors for heart disease and stroke. Your health care provider  will have a target range for your lipid levels based on this information. Treatment for dyslipidemia starts with lifestyle changes, such as diet and exercise. Your health care provider may recommend that you: Get regular exercise. Make changes to your diet. Quit smoking if you smoke. Limit your alcohol intake. If diet changes and exercise do not help you reach your goals, your health care provider may also prescribe medicine to lower lipids. The most commonly prescribed type of medicine lowers your LDL cholesterol (statin drug). If you have a high triglyceride level, your provider may prescribe another type of drug (fibrate) or an  omega-3 fish oil supplement, or both. Follow these instructions at home: Eating and drinking  Follow instructions from your health care provider or dietitian about eating or drinking restrictions. Eat a healthy diet as told by your health care provider. This can help you reach and maintain a healthy weight, lower your LDL cholesterol, and raise your HDL cholesterol. This may include: Limiting your calories, if you are overweight. Eating more fruits, vegetables, whole grains, fish, and lean meats. Limiting saturated fat, trans fat, and cholesterol. Do not drink alcohol if: Your health care provider tells you not to drink. You are pregnant, may be pregnant, or are planning to become pregnant. If you drink alcohol: Limit how much you have to: 0-1 drink a day for women. 0-2 drinks a day for men. Know how much alcohol is in your drink. In the [place] one drink equals one 12 oz bottle of beer (355 mL), one 5 oz glass of wine (148 mL), or one 1 oz glass of hard liquor (44 mL). Activity Get regular exercise. Start an exercise and strength training program as told by your health care provider. Ask your health care provider what activities are safe for you. Your health care provider may recommend: 30 minutes of aerobic activity 4-6 days a week. Brisk walking is an example of aerobic activity. Strength training 2 days a week. General instructions Do not use any products that contain nicotine or tobacco. These products include cigarettes, chewing tobacco, and vaping devices, such as e-cigarettes. If you need help quitting, ask your health care provider. Take over-the-counter and prescription medicines only as told by your health care provider. This includes supplements. Keep all follow-up visits. This is important. Contact a health care provider if: You are having trouble sticking to your exercise or diet plan. You are struggling to quit smoking or to control your use of alcohol. Summary Dyslipidemia  often involves a high level of cholesterol or triglycerides, which are types of lipids. Treatment depends on the type of dyslipidemia that you have and your other risk factors for heart disease and stroke. Treatment for dyslipidemia starts with lifestyle changes, such as diet and exercise. Your health care provider may prescribe medicine to lower lipids. This information is not intended to replace advice given to you by your health care provider. Make sure you discuss any questions you have with your health care provider. Document Revised: 08/10/2020 Document Reviewed: 08/10/2020 Elsevier Patient Education  2023 Elsevier Inc.   - You can try red yeast rice supplement, fiber supplement or eating more grains to help with lowering your cholesterol  Call Dr. Knute Neu at [facility] located at   [address] [place], [place] [address]. [phone].

## 2021-11-25 LAB — BMP8+EGFR
BUN/Creatinine Ratio: 13 (ref 9–20)
BUN: 14 mg/dL (ref 6–24)
CO2: 23 mmol/L (ref 20–29)
Calcium: 9.9 mg/dL (ref 8.7–10.2)
Chloride: 101 mmol/L (ref 96–106)
Creatinine, Ser: 1.07 mg/dL (ref 0.76–1.27)
Glucose: 78 mg/dL (ref 70–99)
Potassium: 4 mmol/L (ref 3.5–5.2)
Sodium: 141 mmol/L (ref 134–144)
eGFR: 89 mL/min/{1.73_m2} (ref >59)

## 2021-11-25 LAB — LIPID PANEL
Chol/HDL Ratio: 2.8 ratio (ref 0.0–5.0)
Cholesterol, Total: 279 mg/dL — ABNORMAL HIGH (ref 100–199)
HDL: 98 mg/dL (ref >39)
LDL Chol Calc (NIH): 144 mg/dL — ABNORMAL HIGH (ref 0–99)
Triglycerides: 213 mg/dL — ABNORMAL HIGH (ref 0–149)
VLDL Cholesterol Cal: 37 mg/dL (ref 5–40)

## 2021-11-25 LAB — HEMOGLOBIN A1C
Est. average glucose Bld gHb Est-mCnc: 97 mg/dL
Hgb A1c MFr Bld: 5 % (ref 4.8–5.6)

## 2021-12-06 ENCOUNTER — Other Ambulatory Visit: Payer: Self-pay | Admitting: Nurse Practitioner

## 2021-12-06 ENCOUNTER — Other Ambulatory Visit (HOSPITAL_COMMUNITY): Payer: Self-pay

## 2021-12-06 DIAGNOSIS — E782 Mixed hyperlipidemia: Secondary | ICD-10-CM

## 2021-12-06 MED ORDER — ATORVASTATIN CALCIUM 10 MG PO TABS
10.0000 mg | ORAL_TABLET | Freq: Every day | ORAL | 2 refills | Status: DC
  Filled 2021-12-06: qty 30, 30d supply, fill #0
  Filled 2022-01-05: qty 30, 30d supply, fill #1

## 2021-12-07 ENCOUNTER — Other Ambulatory Visit (HOSPITAL_COMMUNITY): Payer: Self-pay

## 2021-12-08 ENCOUNTER — Other Ambulatory Visit (HOSPITAL_COMMUNITY): Payer: Self-pay

## 2022-01-05 ENCOUNTER — Other Ambulatory Visit (HOSPITAL_COMMUNITY): Payer: Self-pay

## 2022-01-06 ENCOUNTER — Other Ambulatory Visit (HOSPITAL_COMMUNITY): Payer: Self-pay

## 2022-01-06 ENCOUNTER — Other Ambulatory Visit: Payer: Self-pay | Admitting: Nurse Practitioner

## 2022-01-06 DIAGNOSIS — E782 Mixed hyperlipidemia: Secondary | ICD-10-CM

## 2022-01-06 MED ORDER — ATORVASTATIN CALCIUM 10 MG PO TABS
10.0000 mg | ORAL_TABLET | Freq: Every day | ORAL | 1 refills | Status: DC
  Filled 2022-01-06: qty 90, 90d supply, fill #0

## 2022-01-07 ENCOUNTER — Other Ambulatory Visit (HOSPITAL_COMMUNITY): Payer: Self-pay

## 2022-01-26 ENCOUNTER — Ambulatory Visit (INDEPENDENT_AMBULATORY_CARE_PROVIDER_SITE_OTHER): Payer: Commercial Managed Care - HMO | Admitting: Nurse Practitioner

## 2022-01-26 ENCOUNTER — Encounter: Payer: Self-pay | Admitting: Nurse Practitioner

## 2022-01-26 VITALS — BP 124/68 | HR 86 | Temp 98.7°F | Ht 67.0 in | Wt 172.0 lb

## 2022-01-26 DIAGNOSIS — Z720 Tobacco use: Secondary | ICD-10-CM | POA: Diagnosis not present

## 2022-01-26 DIAGNOSIS — E782 Mixed hyperlipidemia: Secondary | ICD-10-CM | POA: Diagnosis not present

## 2022-01-26 DIAGNOSIS — R748 Abnormal levels of other serum enzymes: Secondary | ICD-10-CM

## 2022-01-26 NOTE — Patient Instructions (Signed)

## 2022-01-26 NOTE — Progress Notes (Signed)
I,Tianna Badgett,acting as a Education administrator for [employer], FNP.,have documented all relevant documentation on the behalf of Minette Brine, FNP,as directed by  Minette Brine, FNP while in the presence of Minette Brine, [place].  Subjective:     Patient ID: Todd Montgomery , male    DOB: 1980-12-21 , 41 y.o.   MRN: [id number]   Chief Complaint  Patient presents with   Hyperlipidemia    HPI  Patient presents today for a follow up new medication statin.   Hyperlipidemia The current episode started more than 1 month ago. The problem is uncontrolled. Recent lipid tests were reviewed and are high. He has no history of chronic renal disease. Pertinent negatives include no chest pain or shortness of breath.  Hypertension This is a chronic problem. The current episode started more than 1 year ago. The problem is controlled. Pertinent negatives include no anxiety, chest pain, headaches, malaise/fatigue, palpitations or shortness of breath. There are no associated agents to hypertension. Risk factors for coronary artery disease include sedentary lifestyle. Past treatments include calcium channel blockers. There are no compliance problems.  There is no history of angina. There is no history of chronic renal disease.     Past Medical History:  Diagnosis Date   Allergy      Family History  Problem Relation Age of Onset   Hypertension Mother    Stroke Mother    Healthy Sister    Hypertension Maternal Grandmother      Current Outpatient Medications:    amLODipine (NORVASC) 2.5 MG tablet, Take 1 tablet (2.5 mg total) by mouth daily., Disp: 90 tablet, Rfl: 1   atorvastatin (LIPITOR) 10 MG tablet, Take 1 tablet (10 mg total) by mouth daily., Disp: 90 tablet, Rfl: 1   fluticasone (FLONASE) 50 MCG/ACT nasal spray, Place 2 sprays into both nostrils daily., Disp: 16 g, Rfl: 2   No Known Allergies   Review of Systems  Constitutional: Negative.  Negative for malaise/fatigue.  Respiratory: Negative.  Negative for  shortness of breath.   Cardiovascular: Negative.  Negative for chest pain and palpitations.  Gastrointestinal: Negative.   Neurological: Negative.  Negative for headaches.     Today's Vitals   01/26/22 1534  BP: 124/68  Pulse: 86  Temp: 98.7 F (37.1 C)  TempSrc: Oral  Weight: 172 lb (78 kg)  Height: _0  (1.702 m)   Body mass index is 26.94 kg/m.   Objective:  Physical Exam Vitals reviewed.  Constitutional:      General: He is not in acute distress.    Appearance: Normal appearance.  Cardiovascular:     Rate and Rhythm: Normal rate and regular rhythm.     Pulses: Normal pulses.     Heart sounds: No murmur heard. Pulmonary:     Effort: Pulmonary effort is normal. No respiratory distress.     Breath sounds: Normal breath sounds. No wheezing.  Skin:    Capillary Refill: Capillary refill takes less than 2 seconds.  Neurological:     General: No focal deficit present.     Mental Status: He is alert and oriented to person, place, and time.     Cranial Nerves: No cranial nerve deficit.     Motor: No weakness.  Psychiatric:        Mood and Affect: Mood normal.        Behavior: Behavior normal.        Thought Content: Thought content normal.        Judgment: Judgment normal.  Assessment And Plan:     1. Mixed hyperlipidemia Comments: Tolerating statin well. Continue medication and encouraged to eat a low fat diet.  - Lipid panel - CMP14+EGFR  2. Tobacco abuse Smoking cessation instruction/counseling given:  counseled patient on the dangers of tobacco use, advised patient to stop smoking, and reviewed strategies to maximize success   Patient was given opportunity to ask questions. Patient verbalized understanding of the plan and was able to repeat key elements of the plan. All questions were answered to their satisfaction.  Minette Brine, FNP   I, Minette Brine, FNP, have reviewed all documentation for this visit. The documentation on 01/26/22 for the exam,  diagnosis, procedures, and orders are all accurate and complete.   IF YOU HAVE BEEN REFERRED TO A SPECIALIST, IT MAY TAKE 1-2 WEEKS TO SCHEDULE/PROCESS THE REFERRAL. IF YOU HAVE NOT HEARD FROM US/SPECIALIST IN TWO WEEKS, PLEASE GIVE [place] A CALL AT [phone] X [phone].   THE PATIENT IS ENCOURAGED TO PRACTICE SOCIAL DISTANCING DUE TO THE COVID-19 PANDEMIC.

## 2022-01-27 LAB — CMP14+EGFR
ALT: 71 IU/L — ABNORMAL HIGH (ref 0–44)
AST: 76 IU/L — ABNORMAL HIGH (ref 0–40)
Albumin/Globulin Ratio: 1.7 (ref 1.2–2.2)
Albumin: 4.7 g/dL (ref 4.1–5.1)
Alkaline Phosphatase: 77 IU/L (ref 44–121)
BUN/Creatinine Ratio: 10 (ref 9–20)
BUN: 11 mg/dL (ref 6–24)
Bilirubin Total: 0.5 mg/dL (ref 0.0–1.2)
CO2: 24 mmol/L (ref 20–29)
Calcium: 9.8 mg/dL (ref 8.7–10.2)
Chloride: 100 mmol/L (ref 96–106)
Creatinine, Ser: 1.11 mg/dL (ref 0.76–1.27)
Globulin, Total: 2.7 g/dL (ref 1.5–4.5)
Glucose: 84 mg/dL (ref 70–99)
Potassium: 4.4 mmol/L (ref 3.5–5.2)
Sodium: 140 mmol/L (ref 134–144)
Total Protein: 7.4 g/dL (ref 6.0–8.5)
eGFR: 85 mL/min/{1.73_m2} (ref >59)

## 2022-01-27 LAB — LIPID PANEL
Chol/HDL Ratio: 2.5 ratio (ref 0.0–5.0)
Cholesterol, Total: 224 mg/dL — ABNORMAL HIGH (ref 100–199)
HDL: 88 mg/dL (ref >39)
LDL Chol Calc (NIH): 119 mg/dL — ABNORMAL HIGH (ref 0–99)
Triglycerides: 98 mg/dL (ref 0–149)
VLDL Cholesterol Cal: 17 mg/dL (ref 5–40)

## 2022-02-06 MED ORDER — ATORVASTATIN CALCIUM 10 MG PO TABS: ORAL_TABLET | ORAL | 1 refills | Status: DC

## 2022-03-01 ENCOUNTER — Ambulatory Visit: Payer: Commercial Managed Care - HMO

## 2022-03-08 ENCOUNTER — Encounter: Payer: Self-pay | Admitting: Nurse Practitioner

## 2022-03-08 ENCOUNTER — Other Ambulatory Visit (HOSPITAL_COMMUNITY): Payer: Self-pay

## 2022-03-08 ENCOUNTER — Ambulatory Visit (INDEPENDENT_AMBULATORY_CARE_PROVIDER_SITE_OTHER): Payer: Commercial Managed Care - HMO | Admitting: Nurse Practitioner

## 2022-03-08 VITALS — BP 104/88 | HR 90 | Temp 98.1°F | Ht 67.0 in | Wt 171.4 lb

## 2022-03-08 DIAGNOSIS — R748 Abnormal levels of other serum enzymes: Secondary | ICD-10-CM | POA: Diagnosis not present

## 2022-03-08 DIAGNOSIS — Z720 Tobacco use: Secondary | ICD-10-CM

## 2022-03-08 DIAGNOSIS — Z789 Other specified health status: Secondary | ICD-10-CM

## 2022-03-08 DIAGNOSIS — M545 Low back pain, unspecified: Secondary | ICD-10-CM

## 2022-03-08 DIAGNOSIS — Z6826 Body mass index (BMI) 26.0-26.9, adult: Secondary | ICD-10-CM

## 2022-03-08 MED ORDER — PREDNISONE 10 MG (21) PO TBPK
ORAL_TABLET | ORAL | 0 refills | Status: DC
  Filled 2022-03-08: qty 21, 6d supply, fill #0

## 2022-03-08 MED ORDER — VITAMIN B-1 100 MG PO TABS
100.0000 mg | ORAL_TABLET | Freq: Every day | ORAL | 1 refills | Status: AC
  Filled 2022-03-08 – 2022-08-15 (×3): qty 90, 90d supply, fill #0
  Filled 2022-11-16: qty 100, 100d supply, fill #0

## 2022-03-08 MED ORDER — CYCLOBENZAPRINE HCL 10 MG PO TABS
10.0000 mg | ORAL_TABLET | Freq: Three times a day (TID) | ORAL | 0 refills | Status: AC | PRN
  Filled 2022-03-08: qty 30, 10d supply, fill #0

## 2022-03-08 NOTE — Progress Notes (Signed)
I,Victoria T Hamilton,acting as a Neurosurgeon for Arnette Felts, FNP.,have documented all relevant documentation on the behalf of Arnette Felts, FNP,as directed by  Arnette Felts, FNP while in the presence of Arnette Felts, FNP.   Subjective:     Patient ID: Todd Montgomery , male    DOB: February 22, 1981 , 41 y.o.   MRN: [id number]   Chief Complaint  Patient presents with   Back Pain    HPI  Patient presents today for back pain. He reports making a movement to get out the car & he felt a pulling sensation. Since Saturday he still suffers with lower back pain. He has not been to work the last 2 days. He had been standing up will occasionally have pain.   He has been taking ibuprofen & tylenol OTC.   He adds, middle finger on right hand still appears swollen.  He does drink alcohol - beer and liquor (3-4 shots and 2-3 beers) daily and likely more than that on Sundays .     Past Medical History:  Diagnosis Date   Allergy      Family History  Problem Relation Age of Onset   Hypertension Mother    Stroke Mother    Healthy Sister    Hypertension Maternal Grandmother      Current Outpatient Medications:    amLODipine (NORVASC) 2.5 MG tablet, Take 1 tablet (2.5 mg total) by mouth daily., Disp: 90 tablet, Rfl: 1   atorvastatin (LIPITOR) 10 MG tablet, Take 1 tab by mouth  MWF, Disp: 45 tablet, Rfl: 1   cyclobenzaprine (FLEXERIL) 10 MG tablet, Take 1 tablet (10 mg total) by mouth 3 (three) times daily as needed for muscle spasms., Disp: 30 tablet, Rfl: 0   fluticasone (FLONASE) 50 MCG/ACT nasal spray, Place 2 sprays into both nostrils daily., Disp: 16 g, Rfl: 2   predniSONE (STERAPRED UNI-PAK 21 TAB) 10 MG (21) TBPK tablet, Take as directed, Disp: 21 tablet, Rfl: 0   thiamine (VITAMIN B1) 100 MG tablet, Take 1 tablet (100 mg total) by mouth daily., Disp: 90 tablet, Rfl: 1   No Known Allergies   Review of Systems  Constitutional: Negative.   Respiratory: Negative.    Cardiovascular: Negative.    Gastrointestinal: Negative.  Negative for abdominal pain.  Musculoskeletal:  Positive for back pain.  Neurological: Negative.  Negative for weakness and numbness.     Today's Vitals   03/08/22 1415  BP: 104/88  Pulse: 90  Temp: 98.1 F (36.7 C)  SpO2: 98%  Weight: 171 lb 6.4 oz (77.7 kg)  Height: 5\' 7"  (1.702 m)  PainSc: 8    Body mass index is 26.85 kg/m.  Wt Readings from Last 3 Encounters:  03/08/22 171 lb 6.4 oz (77.7 kg)  01/26/22 172 lb (78 kg)  11/24/21 172 lb (78 kg)    Objective:  Physical Exam Vitals reviewed.  Constitutional:      Appearance: Normal appearance.  Cardiovascular:     Rate and Rhythm: Normal rate and regular rhythm.     Pulses: Normal pulses.     Heart sounds: Normal heart sounds. No murmur heard. Pulmonary:     Effort: Pulmonary effort is normal. No respiratory distress.     Breath sounds: Normal breath sounds. No wheezing.  Neurological:     Mental Status: He is alert.         Assessment And Plan:     1. Low back pain, unspecified back pain laterality, unspecified chronicity, unspecified whether sciatica present  Comments: Negative straight leg raise, tenderness to right low back at muscular area.  - cyclobenzaprine (FLEXERIL) 10 MG tablet; Take 1 tablet (10 mg total) by mouth 3 (three) times daily as needed for muscle spasms.  Dispense: 30 tablet; Refill: 0 - DG Lumbar Spine Complete; Future - thiamine (VITAMIN B1) 100 MG tablet; Take 1 tablet (100 mg total) by mouth daily.  Dispense: 90 tablet; Refill: 1 - predniSONE (STERAPRED UNI-PAK 21 TAB) 10 MG (21) TBPK tablet; Take as directed  Dispense: 21 tablet; Refill: 0  2. Tobacco abuse Smoking cessation instruction/counseling given:  counseled patient on the dangers of tobacco use, advised patient to stop smoking, and reviewed strategies to maximize success  3. Elevated liver enzymes Comments: Will check GGT and liver profile, reports history of drinking alcohol daily.  - US Abdomen  Limited RUQ (LIVER/GB); Future - Gamma GT - Liver Profile  4. Alcohol use Comments: Will check liver functions and encouraged to slow down on drinking but not to stop abruptly, can cause withdrawals - US Abdomen Limited RUQ (LIVER/GB); Future - Gamma GT - Liver Profile  5. Body mass index (BMI) of 26.0 to 26.9 in adult     Patient was given opportunity to ask questions. Patient verbalized understanding of the plan and was able to repeat key elements of the plan. All questions were answered to their satisfaction.  Minette Brine, FNP   I, Minette Brine, FNP, have reviewed all documentation for this visit. The documentation on 03/08/22 for the exam, diagnosis, procedures, and orders are all accurate and complete.   IF YOU HAVE BEEN REFERRED TO A SPECIALIST, IT MAY TAKE 1-2 WEEKS TO SCHEDULE/PROCESS THE REFERRAL. IF YOU HAVE NOT HEARD FROM US/SPECIALIST IN TWO WEEKS, PLEASE GIVE [place] A CALL AT [phone].   THE PATIENT IS ENCOURAGED TO PRACTICE SOCIAL DISTANCING DUE TO THE COVID-19 PANDEMIC.

## 2022-03-08 NOTE — Patient Instructions (Addendum)
Chronic Back Pain When back pain lasts longer than 3 months, it is called chronic back pain. Pain may get worse at certain times (flare-ups). There are things you can do at home to manage your pain. Follow these instructions at home: Pay attention to any changes in your symptoms. Take these actions to help with your pain: Managing pain and stiffness     If told, put ice on the painful area. Your doctor may tell you to use ice for 24-48 hours after the flare-up starts. To do this: Put ice in a plastic bag. Place a towel between your skin and the bag. Leave the ice on for 20 minutes, 2-3 times a day. If told, put heat on the painful area. Do this as often as told by your doctor. Use the heat source that your doctor recommends, such as a moist heat pack or a heating pad. Place a towel between your skin and the heat source. Leave the heat on for 20-30 minutes. Take off the heat if your skin turns bright red. This is especially important if you are unable to feel pain, heat, or cold. You may have a greater risk of getting burned. Soak in a warm bath. This can help relieve pain. Activity  Avoid bending and other activities that make pain worse. When standing: Keep your upper back and neck straight. Keep your shoulders pulled back. Avoid slouching. When sitting: Keep your back straight. Relax your shoulders. Do not round your shoulders or pull them backward. Do not sit or stand in one place for long periods of time. Take short rest breaks during the day. Lying down or standing is usually better than sitting. Resting can help relieve pain. When sitting or lying down for a long time, do some mild activity or stretching. This will help to prevent stiffness and pain. Get regular exercise. Ask your doctor what activities are safe for you. Do not lift anything that is heavier than 10 lb (4.5 kg) or the limit that you are told, until your doctor says that it is safe. To prevent injury when you lift  things: Bend your knees. Keep the weight close to your body. Avoid twisting. Sleep on a firm mattress. Try lying on your side with your knees slightly bent. If you lie on your back, put a pillow under your knees. Medicines Treatment may include medicines for pain and swelling taken by mouth or put on the skin, prescription pain medicine, or muscle relaxants. Take over-the-counter and prescription medicines only as told by your doctor. Ask your doctor if the medicine prescribed to you: Requires you to avoid driving or using machinery. Can cause trouble pooping (constipation). You may need to take these actions to prevent or treat trouble pooping: Drink enough fluid to keep your pee (urine) pale yellow. Take over-the-counter or prescription medicines. Eat foods that are high in fiber. These include beans, whole grains, and fresh fruits and vegetables. Limit foods that are high in fat and sugars. These include fried or sweet foods. General instructions Do not use any products that contain nicotine or tobacco, such as cigarettes, e-cigarettes, and chewing tobacco. If you need help quitting, ask your doctor. Keep all follow-up visits as told by your doctor. This is important. Contact a doctor if: Your pain does not get better with rest or medicine. Your pain gets worse, or you have new pain. You have a high fever. You lose weight very quickly. You have trouble doing your normal activities. Get help right away   if: One or both of your legs or feet feel weak. One or both of your legs or feet lose feeling (have numbness). You have trouble controlling when you poop (have a bowel movement) or pee (urinate). You have bad back pain and: You feel like you may vomit (nauseous), or you vomit. You have pain in your belly (abdomen). You have shortness of breath. You faint. Summary When back pain lasts longer than 3 months, it is called chronic back pain. Pain may get worse at certain times  (flare-ups). Use ice and heat as told by your doctor. Your doctor may tell you to use ice after flare-ups. This information is not intended to replace advice given to you by your health care provider. Make sure you discuss any questions you have with your health care provider. Document Revised: 07/17/2019 Document Reviewed: 07/17/2019 Elsevier Patient Education  [address] [facility] on [address] for back xray

## 2022-03-09 LAB — HEPATIC FUNCTION PANEL
ALT: 61 IU/L — ABNORMAL HIGH (ref 0–44)
AST: 55 IU/L — ABNORMAL HIGH (ref 0–40)
Albumin: 4.8 g/dL (ref 4.1–5.1)
Alkaline Phosphatase: 76 IU/L (ref 44–121)
Bilirubin Total: 0.5 mg/dL (ref 0.0–1.2)
Bilirubin, Direct: 0.18 mg/dL (ref 0.00–0.40)
Total Protein: 7.4 g/dL (ref 6.0–8.5)

## 2022-03-09 LAB — GAMMA GT: GGT: 89 IU/L — ABNORMAL HIGH (ref 0–65)

## 2022-03-15 ENCOUNTER — Encounter: Payer: Self-pay | Admitting: Nurse Practitioner

## 2022-03-16 ENCOUNTER — Ambulatory Visit
Admission: RE | Admit: 2022-03-16 | Discharge: 2022-03-16 | Disposition: A | Discharge status: Home / Self Care | Payer: Commercial Managed Care - HMO | Source: Ambulatory Visit | Attending: Nurse Practitioner | Admitting: Nurse Practitioner

## 2022-03-16 ENCOUNTER — Encounter: Payer: Self-pay | Admitting: Nurse Practitioner

## 2022-03-16 DIAGNOSIS — Z789 Other specified health status: Secondary | ICD-10-CM

## 2022-03-16 DIAGNOSIS — M545 Low back pain, unspecified: Secondary | ICD-10-CM

## 2022-03-16 DIAGNOSIS — R748 Abnormal levels of other serum enzymes: Secondary | ICD-10-CM

## 2022-05-27 ENCOUNTER — Other Ambulatory Visit (HOSPITAL_COMMUNITY): Payer: Self-pay

## 2022-05-27 ENCOUNTER — Other Ambulatory Visit: Payer: Self-pay | Admitting: Nurse Practitioner

## 2022-05-27 DIAGNOSIS — I10 Essential (primary) hypertension: Secondary | ICD-10-CM

## 2022-05-27 MED ORDER — FLUTICASONE PROPIONATE 50 MCG/ACT NA SUSP
2.0000 | Freq: Every day | NASAL | 2 refills | Status: AC
  Filled 2022-05-27: qty 16, 30d supply, fill #0
  Filled 2022-08-15: qty 16, 30d supply, fill #1
  Filled 2022-11-16: qty 16, 30d supply, fill #2

## 2022-05-27 MED ORDER — AMLODIPINE BESYLATE 2.5 MG PO TABS
2.5000 mg | ORAL_TABLET | Freq: Every day | ORAL | 1 refills | Status: DC
  Filled 2022-05-27: qty 90, 90d supply, fill #0
  Filled 2022-08-15: qty 90, 90d supply, fill #1

## 2022-05-30 ENCOUNTER — Encounter: Payer: Self-pay | Admitting: Nurse Practitioner

## 2022-05-30 ENCOUNTER — Ambulatory Visit (INDEPENDENT_AMBULATORY_CARE_PROVIDER_SITE_OTHER): Payer: Commercial Managed Care - HMO | Admitting: Nurse Practitioner

## 2022-05-30 VITALS — BP 132/80 | HR 97 | Temp 98.1°F | Ht 67.0 in | Wt 173.0 lb

## 2022-05-30 DIAGNOSIS — I10 Essential (primary) hypertension: Secondary | ICD-10-CM | POA: Diagnosis not present

## 2022-05-30 DIAGNOSIS — Z125 Encounter for screening for malignant neoplasm of prostate: Secondary | ICD-10-CM

## 2022-05-30 DIAGNOSIS — Z0000 Encounter for general adult medical examination without abnormal findings: Secondary | ICD-10-CM | POA: Diagnosis not present

## 2022-05-30 DIAGNOSIS — Z720 Tobacco use: Secondary | ICD-10-CM

## 2022-05-30 DIAGNOSIS — E782 Mixed hyperlipidemia: Secondary | ICD-10-CM | POA: Diagnosis not present

## 2022-05-30 DIAGNOSIS — R7309 Other abnormal glucose: Secondary | ICD-10-CM

## 2022-05-30 DIAGNOSIS — Z79899 Other long term (current) drug therapy: Secondary | ICD-10-CM

## 2022-05-30 DIAGNOSIS — R801 Persistent proteinuria, unspecified: Secondary | ICD-10-CM

## 2022-05-30 DIAGNOSIS — K029 Dental caries, unspecified: Secondary | ICD-10-CM

## 2022-05-30 LAB — POCT URINALYSIS DIPSTICK
Bilirubin, UA: NEGATIVE
Blood, UA: NEGATIVE
Glucose, UA: NEGATIVE
Ketones, UA: NEGATIVE
Leukocytes, UA: NEGATIVE
Nitrite, UA: NEGATIVE
Protein, UA: POSITIVE — AB
Spec Grav, UA: 1.025 (ref 1.010–1.025)
Urobilinogen, UA: 1 E.U./dL
pH, UA: 7 (ref 5.0–8.0)

## 2022-05-30 NOTE — Patient Instructions (Signed)
Health Maintenance, Male Adopting a healthy lifestyle and getting preventive care are important in promoting health and wellness. Ask your health care provider about: The right schedule for you to have regular tests and exams. Things you can do on your own to prevent diseases and keep yourself healthy. What should I know about diet, weight, and exercise? Eat a healthy diet  Eat a diet that includes plenty of vegetables, fruits, low-fat dairy products, and lean protein. Do not eat a lot of foods that are high in solid fats, added sugars, or sodium. Maintain a healthy weight Body mass index (BMI) is a measurement that can be used to identify possible weight problems. It estimates body fat based on height and weight. Your health care provider can help determine your BMI and help you achieve or maintain a healthy weight. Get regular exercise Get regular exercise. This is one of the most important things you can do for your health. Most adults should: Exercise for at least 150 minutes each week. The exercise should increase your heart rate and make you sweat (moderate-intensity exercise). Do strengthening exercises at least twice a week. This is in addition to the moderate-intensity exercise. Spend less time sitting. Even light physical activity can be beneficial. Watch cholesterol and blood lipids Have your blood tested for lipids and cholesterol at 42 years of age, then have this test every 5 years. You may need to have your cholesterol levels checked more often if: Your lipid or cholesterol levels are high. You are older than 42 years of age. You are at high risk for heart disease. What should I know about cancer screening? Many types of cancers can be detected early and may often be prevented. Depending on your health history and family history, you may need to have cancer screening at various ages. This may include screening for: Colorectal cancer. Prostate cancer. Skin cancer. Lung  cancer. What should I know about heart disease, diabetes, and high blood pressure? Blood pressure and heart disease High blood pressure causes heart disease and increases the risk of stroke. This is more likely to develop in people who have high blood pressure readings or are overweight. Talk with your health care provider about your target blood pressure readings. Have your blood pressure checked: Every 3-5 years if you are 18-39 years of age. Every year if you are 40 years old or older. If you are between the ages of 65 and 75 and are a current or former smoker, ask your health care provider if you should have a one-time screening for abdominal aortic aneurysm (AAA). Diabetes Have regular diabetes screenings. This checks your fasting blood sugar level. Have the screening done: Once every three years after age 45 if you are at a normal weight and have a low risk for diabetes. More often and at a younger age if you are overweight or have a high risk for diabetes. What should I know about preventing infection? Hepatitis B If you have a higher risk for hepatitis B, you should be screened for this virus. Talk with your health care provider to find out if you are at risk for hepatitis B infection. Hepatitis C Blood testing is recommended for: Everyone born from 1945 through 1965. Anyone with known risk factors for hepatitis C. Sexually transmitted infections (STIs) You should be screened each year for STIs, including gonorrhea and chlamydia, if: You are sexually active and are younger than 42 years of age. You are older than 42 years of age and your   health care provider tells you that you are at risk for this type of infection. Your sexual activity has changed since you were last screened, and you are at increased risk for chlamydia or gonorrhea. Ask your health care provider if you are at risk. Ask your health care provider about whether you are at high risk for HIV. Your health care provider  may recommend a prescription medicine to help prevent HIV infection. If you choose to take medicine to prevent HIV, you should first get tested for HIV. You should then be tested every 3 months for as long as you are taking the medicine. Follow these instructions at home: Alcohol use Do not drink alcohol if your health care provider tells you not to drink. If you drink alcohol: Limit how much you have to 0-2 drinks a day. Know how much alcohol is in your drink. In the [place] one drink equals one 12 oz bottle of beer (355 mL), one 5 oz glass of wine (148 mL), or one 1 oz glass of hard liquor (44 mL). Lifestyle Do not use any products that contain nicotine or tobacco. These products include cigarettes, chewing tobacco, and vaping devices, such as e-cigarettes. If you need help quitting, ask your health care provider. Do not use street drugs. Do not share needles. Ask your health care provider for help if you need support or information about quitting drugs. General instructions Schedule regular health, dental, and eye exams. Stay current with your vaccines. Tell your health care provider if: You often feel depressed. You have ever been abused or do not feel safe at home. Summary Adopting a healthy lifestyle and getting preventive care are important in promoting health and wellness. Follow your health care provider's instructions about healthy diet, exercising, and getting tested or screened for diseases. Follow your health care provider's instructions on monitoring your cholesterol and blood pressure. This information is not intended to replace advice given to you by your health care provider. Make sure you discuss any questions you have with your health care provider. Document Revised: 10/26/2020 Document Reviewed: 10/26/2020 Elsevier Patient Education  2023 Elsevier Inc.  

## 2022-05-30 NOTE — Progress Notes (Signed)
I,Tianna Badgett,acting as a Education administrator for [employer], FNP.,have documented all relevant documentation on the behalf of Minette Brine, FNP,as directed by  Minette Brine, FNP while in the presence of Minette Brine, [place].  Subjective:     Patient ID: Todd Montgomery , male    DOB: 1980-08-25 , 41 y.o.   MRN: [id number]   Chief Complaint  Patient presents with   Annual Exam    HPI  Here for HM.       Past Medical History:  Diagnosis Date   Allergy      Family History  Problem Relation Age of Onset   Hypertension Mother    Stroke Mother    Healthy Sister    Hypertension Maternal Grandmother      Current Outpatient Medications:    amLODipine (NORVASC) 2.5 MG tablet, Take 1 tablet (2.5 mg total) by mouth daily., Disp: 90 tablet, Rfl: 1   atorvastatin (LIPITOR) 10 MG tablet, Take 1 tab by mouth  MWF, Disp: 45 tablet, Rfl: 1   cyclobenzaprine (FLEXERIL) 10 MG tablet, Take 1 tablet (10 mg total) by mouth 3 (three) times daily as needed for muscle spasms., Disp: 30 tablet, Rfl: 0   fluticasone (FLONASE) 50 MCG/ACT nasal spray, Place 2 sprays into both nostrils daily., Disp: 16 g, Rfl: 2   thiamine (VITAMIN B1) 100 MG tablet, Take 1 tablet (100 mg total) by mouth daily., Disp: 90 tablet, Rfl: 1   No Known Allergies   Men's preventive visit. Patient Health Questionnaire (PHQ-2) is  [place] Office Visit from 05/30/2022 in [facility]  PHQ-2 Total Score 0      Patient is on a Regular diet. Exercising - not as much as before. Continues to work a full time job. Marital status: Single. Relevant history for alcohol use is:  Social History   Substance and Sexual Activity  Alcohol Use Yes   Relevant history for tobacco use is:  Social History   Tobacco Use  Smoking Status Every Day   Packs/day: 0.50   Years: 15.00   Total pack years: 7.50   Types: Cigarettes  Smokeless Tobacco Never  .   Review of Systems  Constitutional: Negative.   HENT: Negative.     Eyes: Negative.   Respiratory: Negative.    Cardiovascular: Negative.   Gastrointestinal: Negative.   Endocrine: Negative.   Genitourinary: Negative.   Musculoskeletal: Negative.   Skin: Negative.   Neurological: Negative.  Negative for dizziness and headaches.  Hematological: Negative.   Psychiatric/Behavioral: Negative.       Today's Vitals   05/30/22 1432  BP: 132/80  Pulse: 97  Temp: 98.1 F (36.7 C)  TempSrc: Oral  Weight: 173 lb (78.5 kg)  Height: _0  (1.702 m)   Body mass index is 27.1 kg/m.   Objective:  Physical Exam Vitals reviewed.  Constitutional:      General: He is not in acute distress.    Appearance: Normal appearance.  HENT:     Head: Normocephalic and atraumatic.     Right Ear: Tympanic membrane, ear canal and external ear normal. There is no impacted cerumen.     Left Ear: Tympanic membrane, ear canal and external ear normal. There is no impacted cerumen.     Nose: Nose normal.     Mouth/Throat:     Mouth: Mucous membranes are moist.      Comments: Right lower molar with chipped tooth that has a filling that came out per patient Eyes:  Extraocular Movements: Extraocular movements intact.     Conjunctiva/sclera: Conjunctivae normal.     Pupils: Pupils are equal, round, and reactive to light.  Cardiovascular:     Rate and Rhythm: Normal rate and regular rhythm.     Pulses: Normal pulses.     Heart sounds: Normal heart sounds. No murmur heard. Pulmonary:     Effort: Pulmonary effort is normal. No respiratory distress.     Breath sounds: Normal breath sounds. No wheezing.  Abdominal:     General: Abdomen is flat. Bowel sounds are normal. There is no distension.     Palpations: Abdomen is soft. There is no mass.     Tenderness: There is no abdominal tenderness.  Genitourinary:    Comments: Deferred - has Urologist Musculoskeletal:        General: No swelling or tenderness. Normal range of motion.     Cervical back: Normal range of  motion and neck supple.  Skin:    General: Skin is warm and dry.     Capillary Refill: Capillary refill takes less than 2 seconds.     Findings: No bruising.  Neurological:     General: No focal deficit present.     Mental Status: He is alert and oriented to person, place, and time.     Cranial Nerves: No cranial nerve deficit.     Motor: No weakness.  Psychiatric:        Mood and Affect: Mood normal.        Behavior: Behavior normal.        Thought Content: Thought content normal.        Judgment: Judgment normal.         Assessment And Plan:    1. Encounter for general adult medical examination w/o abnormal findings Behavior modifications discussed and diet history reviewed.   Pt will continue to exercise regularly and modify diet with low GI, plant based foods and decrease intake of processed foods.  Recommend intake of daily multivitamin, Vitamin D, and calcium.  Recommend colonoscopy for preventive screenings, as well as recommend immunizations that include influenza, TDAP. Encouraged to get newest covid vaccine.   2. Essential hypertension Comments: Blood pressure is fairly controlled. Continue current medications. EKG done with NSR HR 89 - EKG 12-Lead - POCT Urinalysis Dipstick (81002) - Microalbumin / Creatinine Urine Ratio  3. Mixed hyperlipidemia Comments: Cholesterol levels are slightly improved at last visit. Continue low fat diet and statin, tolerating well - CMP14+EGFR - Lipid panel  4. Abnormal glucose Comments: HgbA1c has been stable. Continue focusing on cutting back on carbs and starches. - Hemoglobin A1c  5. Tobacco abuse Smoking cessation instruction/counseling given:  counseled patient on the dangers of tobacco use, advised patient to stop smoking, and reviewed strategies to maximize success  6. Other long term (current) drug therapy - CBC  7. Encounter for prostate cancer screening Comments: Prostate is slightly enlarged, will check PSA and  consider referral back to Urology for evaluation - PSA    Patient was given opportunity to ask questions. Patient verbalized understanding of the plan and was able to repeat key elements of the plan. All questions were answered to their satisfaction.   Minette Brine, FNP   I, Minette Brine, FNP, have reviewed all documentation for this visit. The documentation on 05/30/22 for the exam, diagnosis, procedures, and orders are all accurate and complete.   THE PATIENT IS ENCOURAGED TO PRACTICE SOCIAL DISTANCING DUE TO THE COVID-19 PANDEMIC.

## 2022-05-31 LAB — CMP14+EGFR
ALT: 40 IU/L (ref 0–44)
AST: 37 IU/L (ref 0–40)
Albumin/Globulin Ratio: 1.7 (ref 1.2–2.2)
Albumin: 4.8 g/dL (ref 4.1–5.1)
Alkaline Phosphatase: 84 IU/L (ref 44–121)
BUN/Creatinine Ratio: 9 (ref 9–20)
BUN: 9 mg/dL (ref 6–24)
Bilirubin Total: 0.5 mg/dL (ref 0.0–1.2)
CO2: 26 mmol/L (ref 20–29)
Calcium: 9.8 mg/dL (ref 8.7–10.2)
Chloride: 100 mmol/L (ref 96–106)
Creatinine, Ser: 1.01 mg/dL (ref 0.76–1.27)
Globulin, Total: 2.8 g/dL (ref 1.5–4.5)
Glucose: 118 mg/dL — ABNORMAL HIGH (ref 70–99)
Potassium: 3.9 mmol/L (ref 3.5–5.2)
Sodium: 141 mmol/L (ref 134–144)
Total Protein: 7.6 g/dL (ref 6.0–8.5)
eGFR: 95 mL/min/{1.73_m2} (ref >59)

## 2022-05-31 LAB — CBC
Hematocrit: 43.8 % (ref 37.5–51.0)
Hemoglobin: 15.8 g/dL (ref 13.0–17.7)
MCH: 32.4 pg (ref 26.6–33.0)
MCHC: 36.1 g/dL — ABNORMAL HIGH (ref 31.5–35.7)
MCV: 90 fL (ref 79–97)
Platelets: 208 10*3/uL (ref 150–450)
RBC: 4.88 x10E6/uL (ref 4.14–5.80)
RDW: 11 % — ABNORMAL LOW (ref 11.6–15.4)
WBC: 4.3 10*3/uL (ref 3.4–10.8)

## 2022-05-31 LAB — LIPID PANEL
Chol/HDL Ratio: 2.5 ratio (ref 0.0–5.0)
Cholesterol, Total: 196 mg/dL (ref 100–199)
HDL: 79 mg/dL (ref >39)
LDL Chol Calc (NIH): 97 mg/dL (ref 0–99)
Triglycerides: 116 mg/dL (ref 0–149)
VLDL Cholesterol Cal: 20 mg/dL (ref 5–40)

## 2022-05-31 LAB — MICROALBUMIN / CREATININE URINE RATIO
Creatinine, Urine: 197 mg/dL
Microalb/Creat Ratio: 6 mg/g creat (ref 0–29)
Microalbumin, Urine: 12.7 ug/mL

## 2022-05-31 LAB — PSA: Prostate Specific Ag, Serum: 1.3 ng/mL (ref 0.0–4.0)

## 2022-07-05 ENCOUNTER — Other Ambulatory Visit: Payer: Self-pay | Admitting: Nurse Practitioner

## 2022-07-05 DIAGNOSIS — R801 Persistent proteinuria, unspecified: Secondary | ICD-10-CM

## 2022-07-27 LAB — CBC: RBC: 4.79 (ref 3.87–5.11)

## 2022-07-27 LAB — COMPREHENSIVE METABOLIC PANEL
Albumin: 4.7 (ref 3.5–5.0)
Calcium: 9.9 (ref 8.7–10.7)
eGFR: 95

## 2022-07-27 LAB — BASIC METABOLIC PANEL
CO2: 24 — AB (ref 13–22)
Chloride: 101 (ref 99–108)
Creatinine: 1 (ref 0.6–1.3)
Glucose: 81
Potassium: 4.1 mEq/L (ref 3.5–5.1)
Sodium: 141 (ref 137–147)

## 2022-07-27 LAB — CBC AND DIFFERENTIAL
HCT: 45 (ref 41–53)
Hemoglobin: 15.3 (ref 13.5–17.5)
Neutrophils Absolute: 2.1
Platelets: 222 10*3/uL (ref 150–400)
WBC: 4.4

## 2022-08-15 ENCOUNTER — Other Ambulatory Visit: Payer: Self-pay

## 2022-08-15 ENCOUNTER — Other Ambulatory Visit (HOSPITAL_COMMUNITY): Payer: Self-pay

## 2022-08-17 ENCOUNTER — Other Ambulatory Visit (HOSPITAL_BASED_OUTPATIENT_CLINIC_OR_DEPARTMENT_OTHER): Payer: Self-pay

## 2022-09-29 ENCOUNTER — Ambulatory Visit: Payer: Commercial Managed Care - HMO | Admitting: Nurse Practitioner

## 2022-10-18 ENCOUNTER — Ambulatory Visit: Payer: Commercial Managed Care - HMO | Admitting: Nurse Practitioner

## 2022-10-25 ENCOUNTER — Ambulatory Visit: Payer: Commercial Managed Care - HMO | Admitting: Nurse Practitioner

## 2022-11-16 ENCOUNTER — Other Ambulatory Visit: Payer: Self-pay | Admitting: Nurse Practitioner

## 2022-11-16 ENCOUNTER — Other Ambulatory Visit (HOSPITAL_COMMUNITY): Payer: Self-pay

## 2022-11-16 DIAGNOSIS — I10 Essential (primary) hypertension: Secondary | ICD-10-CM

## 2022-11-17 ENCOUNTER — Other Ambulatory Visit (HOSPITAL_COMMUNITY): Payer: Self-pay

## 2022-11-17 ENCOUNTER — Other Ambulatory Visit: Payer: Self-pay

## 2022-11-17 MED ORDER — AMLODIPINE BESYLATE 2.5 MG PO TABS
2.5000 mg | ORAL_TABLET | Freq: Every day | ORAL | 1 refills | Status: DC
  Filled 2022-11-17: qty 90, 90d supply, fill #0

## 2023-06-01 ENCOUNTER — Encounter: Payer: Commercial Managed Care - HMO | Admitting: Nurse Practitioner

## 2023-08-09 ENCOUNTER — Encounter: Payer: Commercial Managed Care - HMO | Admitting: Nurse Practitioner

## 2023-08-21 ENCOUNTER — Ambulatory Visit: Payer: Commercial Managed Care - HMO | Admitting: Nurse Practitioner

## 2023-08-21 ENCOUNTER — Encounter: Payer: Self-pay | Admitting: Nurse Practitioner

## 2023-08-21 ENCOUNTER — Other Ambulatory Visit (HOSPITAL_COMMUNITY): Payer: Self-pay

## 2023-08-21 VITALS — BP 150/96 | HR 85 | Temp 98.3°F | Ht 67.0 in | Wt 173.6 lb

## 2023-08-21 DIAGNOSIS — I10 Essential (primary) hypertension: Secondary | ICD-10-CM | POA: Diagnosis not present

## 2023-08-21 DIAGNOSIS — R7309 Other abnormal glucose: Secondary | ICD-10-CM | POA: Diagnosis not present

## 2023-08-21 DIAGNOSIS — E782 Mixed hyperlipidemia: Secondary | ICD-10-CM

## 2023-08-21 DIAGNOSIS — Z79899 Other long term (current) drug therapy: Secondary | ICD-10-CM

## 2023-08-21 DIAGNOSIS — H5369 Other night blindness: Secondary | ICD-10-CM | POA: Diagnosis not present

## 2023-08-21 DIAGNOSIS — Z0000 Encounter for general adult medical examination without abnormal findings: Secondary | ICD-10-CM | POA: Diagnosis not present

## 2023-08-21 DIAGNOSIS — Z125 Encounter for screening for malignant neoplasm of prostate: Secondary | ICD-10-CM

## 2023-08-21 LAB — POCT URINALYSIS DIP (CLINITEK)
Bilirubin, UA: NEGATIVE
Blood, UA: NEGATIVE
Glucose, UA: NEGATIVE mg/dL
Ketones, POC UA: NEGATIVE mg/dL
Leukocytes, UA: NEGATIVE
Nitrite, UA: NEGATIVE
POC PROTEIN,UA: 100 — AB
Spec Grav, UA: 1.02 (ref 1.010–1.025)
Urobilinogen, UA: 1 U/dL
pH, UA: 7.5 (ref 5.0–8.0)

## 2023-08-21 MED ORDER — ATORVASTATIN CALCIUM 10 MG PO TABS
10.0000 mg | ORAL_TABLET | ORAL | 1 refills | Status: DC
  Filled 2023-08-21: qty 36, 84d supply, fill #0
  Filled 2023-12-01: qty 36, 84d supply, fill #1

## 2023-08-21 MED ORDER — AMLODIPINE BESYLATE 2.5 MG PO TABS
2.5000 mg | ORAL_TABLET | Freq: Every day | ORAL | 1 refills | Status: DC
  Filled 2023-08-21: qty 90, 90d supply, fill #0
  Filled 2023-12-01: qty 90, 90d supply, fill #1

## 2023-08-21 NOTE — Progress Notes (Signed)
 Madelaine Bhat, CMA,acting as a Neurosurgeon for Arnette Felts, FNP.,have documented all relevant documentation on the behalf of Arnette Felts, FNP,as directed by  Arnette Felts, FNP while in the presence of Arnette Felts, FNP.  Subjective:   Patient ID: Todd Montgomery , male    DOB: 1981-05-08 , 43 y.o.   MRN: [id number]  Chief Complaint  Patient presents with   Annual Exam    HPI  Patient presents today for HM, Patient reports compliance with medication. Patient denies any chest pain, SOB, or headaches. Patient has no concerns today. He has been having a difficult time with the loss of his friend who was in an 59 wheeler accident.   Having difficulty with seeing at night and when raining.      Past Medical History:  Diagnosis Date   Allergy      Family History  Problem Relation Age of Onset   Hypertension Mother    Stroke Mother    Healthy Sister    Hypertension Maternal Grandmother      Current Outpatient Medications:    cyclobenzaprine (FLEXERIL) 10 MG tablet, Take 1 tablet (10 mg total) by mouth 3 (three) times daily as needed for muscle spasms., Disp: 30 tablet, Rfl: 0   fluticasone (FLONASE) 50 MCG/ACT nasal spray, Place 2 sprays into both nostrils daily., Disp: 16 g, Rfl: 2   thiamine (VITAMIN B-1) 100 MG tablet, Take 1 tablet (100 mg total) by mouth daily., Disp: 100 tablet, Rfl: 1   amLODipine (NORVASC) 2.5 MG tablet, Take 1 tablet (2.5 mg total) by mouth daily., Disp: 90 tablet, Rfl: 1   atorvastatin (LIPITOR) 10 MG tablet, Take 1 tablet (10 mg total) by mouth 3 (three) times a week on Monday, Wednesday and Fridays, Disp: 90 tablet, Rfl: 1   No Known Allergies   Men's preventive visit. Patient Health Questionnaire (PHQ-2) is  Flowsheet Row Office Visit from 08/21/2023 in [facility]  PHQ-2 Total Score 0     Patient is on a Regular diet; he has cut back his chicken intake to 2 times a wee. Exercise is limited but does a lot of walking at  work. Marital status: Single. Relevant history for alcohol use is:  Social History   Substance and Sexual Activity  Alcohol Use Yes   Relevant history for tobacco use is:  Social History   Tobacco Use  Smoking Status Every Day   Current packs/day: 0.50   Average packs/day: 0.5 packs/day for 15.0 years (7.5 ttl pk-yrs)   Types: Cigarettes  Smokeless Tobacco Never  .   Review of Systems  Constitutional: Negative.   HENT: Negative.    Eyes: Negative.   Respiratory: Negative.    Cardiovascular: Negative.   Gastrointestinal: Negative.   Endocrine: Negative.   Genitourinary: Negative.   Musculoskeletal: Negative.   Skin: Negative.   Neurological: Negative.  Negative for dizziness and headaches.  Hematological: Negative.   Psychiatric/Behavioral: Negative.       Today's Vitals   08/21/23 1455 08/21/23 1507  BP: (!) 160/100 (!) 150/96  Pulse: 85   Temp: 98.3 F (36.8 C)   TempSrc: Oral   Weight: 173 lb 9.6 oz (78.7 kg)   Height: 5\' 7"  (1.702 m)   PainSc: 0-No pain    Body mass index is 27.19 kg/m.  Wt Readings from Last 3 Encounters:  08/21/23 173 lb 9.6 oz (78.7 kg)  05/30/22 173 lb (78.5 kg)  03/08/22 171 lb 6.4 oz (77.7 kg)  Objective:  Physical Exam Vitals reviewed.  Constitutional:      General: He is not in acute distress.    Appearance: Normal appearance.  HENT:     Head: Normocephalic and atraumatic.     Right Ear: Tympanic membrane, ear canal and external ear normal. There is no impacted cerumen.     Left Ear: Tympanic membrane, ear canal and external ear normal. There is no impacted cerumen.     Nose: Nose normal.     Mouth/Throat:     Mouth: Mucous membranes are moist.      Comments: Right lower molar with chipped tooth that has a filling that came out per patient Eyes:     Extraocular Movements: Extraocular movements intact.     Conjunctiva/sclera: Conjunctivae normal.     Pupils: Pupils are equal, round, and reactive to light.   Cardiovascular:     Rate and Rhythm: Normal rate and regular rhythm.     Pulses: Normal pulses.     Heart sounds: Normal heart sounds. No murmur heard. Pulmonary:     Effort: Pulmonary effort is normal. No respiratory distress.     Breath sounds: Normal breath sounds. No wheezing.  Abdominal:     General: Abdomen is flat. Bowel sounds are normal. There is no distension.     Palpations: Abdomen is soft. There is no mass.     Tenderness: There is no abdominal tenderness.  Genitourinary:    Comments: Deferred  Musculoskeletal:        General: No swelling or tenderness. Normal range of motion.     Cervical back: Normal range of motion and neck supple.  Skin:    General: Skin is warm and dry.     Capillary Refill: Capillary refill takes less than 2 seconds.     Findings: No bruising.  Neurological:     General: No focal deficit present.     Mental Status: He is alert and oriented to person, place, and time.     Cranial Nerves: No cranial nerve deficit.     Motor: No weakness.  Psychiatric:        Mood and Affect: Mood normal.        Behavior: Behavior normal.        Thought Content: Thought content normal.        Judgment: Judgment normal.      Assessment And Plan:    Encounter for annual health examination Assessment & Plan: Behavior modifications discussed and diet history reviewed.   Pt will continue to exercise regularly and modify diet with low GI, plant based foods and decrease intake of processed foods.  Recommend intake of daily multivitamin, Vitamin D, and calcium.  Recommend for preventive screenings, as well as recommend immunizations that include influenza, TDAP    Essential hypertension Assessment & Plan: Blood pressure is elevated, rechecked was better but still elevated, has not had his medications, will restart medications  Orders: -     EKG 12-Lead -     POCT URINALYSIS DIP (CLINITEK) -     Microalbumin / creatinine urine ratio -     CMP14+EGFR -      amLODIPine Besylate; Take 1 tablet (2.5 mg total) by mouth daily.  Dispense: 90 tablet; Refill: 1  Mixed hyperlipidemia Assessment & Plan: Cholesterol levels were elevated at last visit, has not been on his medications. Advised to follow a low fat diet.  Orders: -     Lipid panel -     Atorvastatin  Calcium; Take 1 tablet (10 mg total) by mouth 3 (three) times a week on Monday, Wednesday and Fridays  Dispense: 90 tablet; Refill: 1  Abnormal glucose Assessment & Plan: Will recheck levels. Advised to follow a low carb/sugar diet.   Orders: -     Hemoglobin A1c  Diminished night vision Assessment & Plan: Will refer to ophthalmology has not seen an eye doctor in the past.   Orders: -     Ambulatory referral to Ophthalmology  Other long term (current) drug therapy -     CBC with Differential/Platelet  Encounter for prostate cancer screening -     PSA   Return for 1 year physical, 6 month bp check; 1 month [place] BP check. Patient was given opportunity to ask questions. Patient verbalized understanding of the plan and was able to repeat key elements of the plan. All questions were answered to their satisfaction.   Arnette Felts, FNP  I, Arnette Felts, FNP, have reviewed all documentation for this visit. The documentation on 08/21/23 for the exam, diagnosis, procedures, and orders are all accurate and complete.

## 2023-08-21 NOTE — Patient Instructions (Signed)
 Health Maintenance  Topic Date Due   Pneumococcal Vaccination (1 of 2 - PCV) Never done   COVID-19 Vaccine (3 - Moderna risk series) 01/10/2020   Flu Shot  01/19/2023   DTaP/Tdap/Td vaccine (2 - Td or Tdap) 05/19/2030   Hepatitis C Screening  Completed   HIV Screening  Completed   HPV Vaccine  Aged Out

## 2023-08-23 ENCOUNTER — Encounter: Payer: Self-pay | Admitting: Nurse Practitioner

## 2023-08-23 LAB — CBC WITH DIFFERENTIAL/PLATELET
Basophils Absolute: 0 10*3/uL (ref 0.0–0.2)
Basos: 1 %
EOS (ABSOLUTE): 0 10*3/uL (ref 0.0–0.4)
Eos: 1 %
Hematocrit: 43.6 % (ref 37.5–51.0)
Hemoglobin: 15 g/dL (ref 13.0–17.7)
Immature Grans (Abs): 0 10*3/uL (ref 0.0–0.1)
Immature Granulocytes: 0 %
Lymphocytes Absolute: 1.2 10*3/uL (ref 0.7–3.1)
Lymphs: 28 %
MCH: 32.1 pg (ref 26.6–33.0)
MCHC: 34.4 g/dL (ref 31.5–35.7)
MCV: 93 fL (ref 79–97)
Monocytes Absolute: 0.6 10*3/uL (ref 0.1–0.9)
Monocytes: 13 %
Neutrophils Absolute: 2.5 10*3/uL (ref 1.4–7.0)
Neutrophils: 57 %
Platelets: 203 10*3/uL (ref 150–450)
RBC: 4.67 x10E6/uL (ref 4.14–5.80)
RDW: 11.8 % (ref 11.6–15.4)
WBC: 4.3 10*3/uL (ref 3.4–10.8)

## 2023-08-23 LAB — CMP14+EGFR
ALT: 79 IU/L — ABNORMAL HIGH (ref 0–44)
AST: 73 IU/L — ABNORMAL HIGH (ref 0–40)
Albumin: 4.6 g/dL (ref 4.1–5.1)
Alkaline Phosphatase: 81 IU/L (ref 44–121)
BUN/Creatinine Ratio: 12 (ref 9–20)
BUN: 14 mg/dL (ref 6–24)
Bilirubin Total: 0.5 mg/dL (ref 0.0–1.2)
CO2: 25 mmol/L (ref 20–29)
Calcium: 9.8 mg/dL (ref 8.7–10.2)
Chloride: 102 mmol/L (ref 96–106)
Creatinine, Ser: 1.14 mg/dL (ref 0.76–1.27)
Globulin, Total: 2.7 g/dL (ref 1.5–4.5)
Glucose: 82 mg/dL (ref 70–99)
Potassium: 4.2 mmol/L (ref 3.5–5.2)
Sodium: 142 mmol/L (ref 134–144)
Total Protein: 7.3 g/dL (ref 6.0–8.5)
eGFR: 82 mL/min/{1.73_m2} (ref >59)

## 2023-08-23 LAB — HEMOGLOBIN A1C
Est. average glucose Bld gHb Est-mCnc: 97 mg/dL
Hgb A1c MFr Bld: 5 % (ref 4.8–5.6)

## 2023-08-23 LAB — LIPID PANEL
Chol/HDL Ratio: 2.4 ratio (ref 0.0–5.0)
Cholesterol, Total: 266 mg/dL — ABNORMAL HIGH (ref 100–199)
HDL: 110 mg/dL (ref >39)
LDL Chol Calc (NIH): 139 mg/dL — ABNORMAL HIGH (ref 0–99)
Triglycerides: 101 mg/dL (ref 0–149)
VLDL Cholesterol Cal: 17 mg/dL (ref 5–40)

## 2023-08-23 LAB — MICROALBUMIN / CREATININE URINE RATIO
Creatinine, Urine: 271.4 mg/dL
Microalb/Creat Ratio: 6 mg/g{creat} (ref 0–29)
Microalbumin, Urine: 17.6 ug/mL

## 2023-08-23 LAB — PSA: Prostate Specific Ag, Serum: 1.7 ng/mL (ref 0.0–4.0)

## 2023-08-27 NOTE — Assessment & Plan Note (Signed)
 Cholesterol levels were elevated at last visit, has not been on his medications. Advised to follow a low fat diet.

## 2023-08-27 NOTE — Assessment & Plan Note (Addendum)
 Blood pressure is elevated, rechecked was better but still elevated, has not had his medications, will restart medications. EKG done with NSR HR 79

## 2023-08-27 NOTE — Assessment & Plan Note (Signed)
 Will refer to ophthalmology has not seen an eye doctor in the past.

## 2023-08-27 NOTE — Assessment & Plan Note (Signed)
 Behavior modifications discussed and diet history reviewed.   Pt will continue to exercise regularly and modify diet with low GI, plant based foods and decrease intake of processed foods.  Recommend intake of daily multivitamin, Vitamin D, and calcium.  Recommend for preventive screenings, as well as recommend immunizations that include influenza, TDAP

## 2023-08-27 NOTE — Assessment & Plan Note (Signed)
 Will recheck levels. Advised to follow a low carb/sugar diet.

## 2023-09-15 IMAGING — CR DG HAND COMPLETE 3+V*R*
3 series · 3 of 3 positions shown · non-contrast
Comparison: None Available.

CLINICAL DATA: Right middle finger jammed playing basketball.
Swelling.

EXAM:
RIGHT HAND - COMPLETE 3+ VIEW

[x hand pa right]
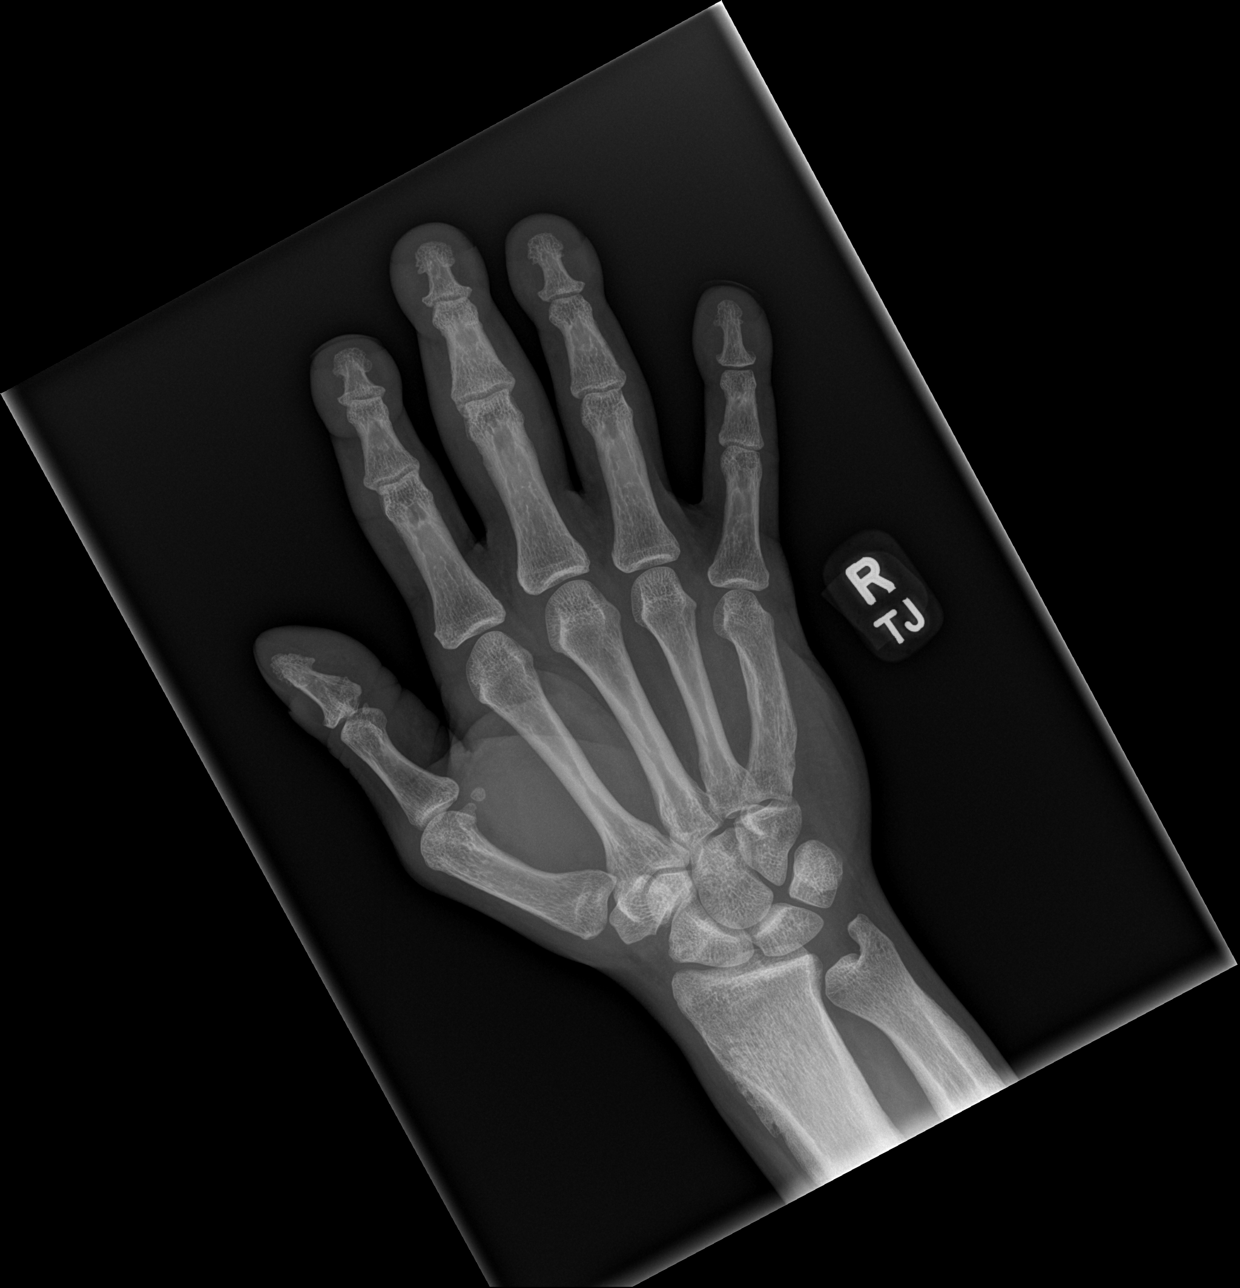

[x hand obl right]
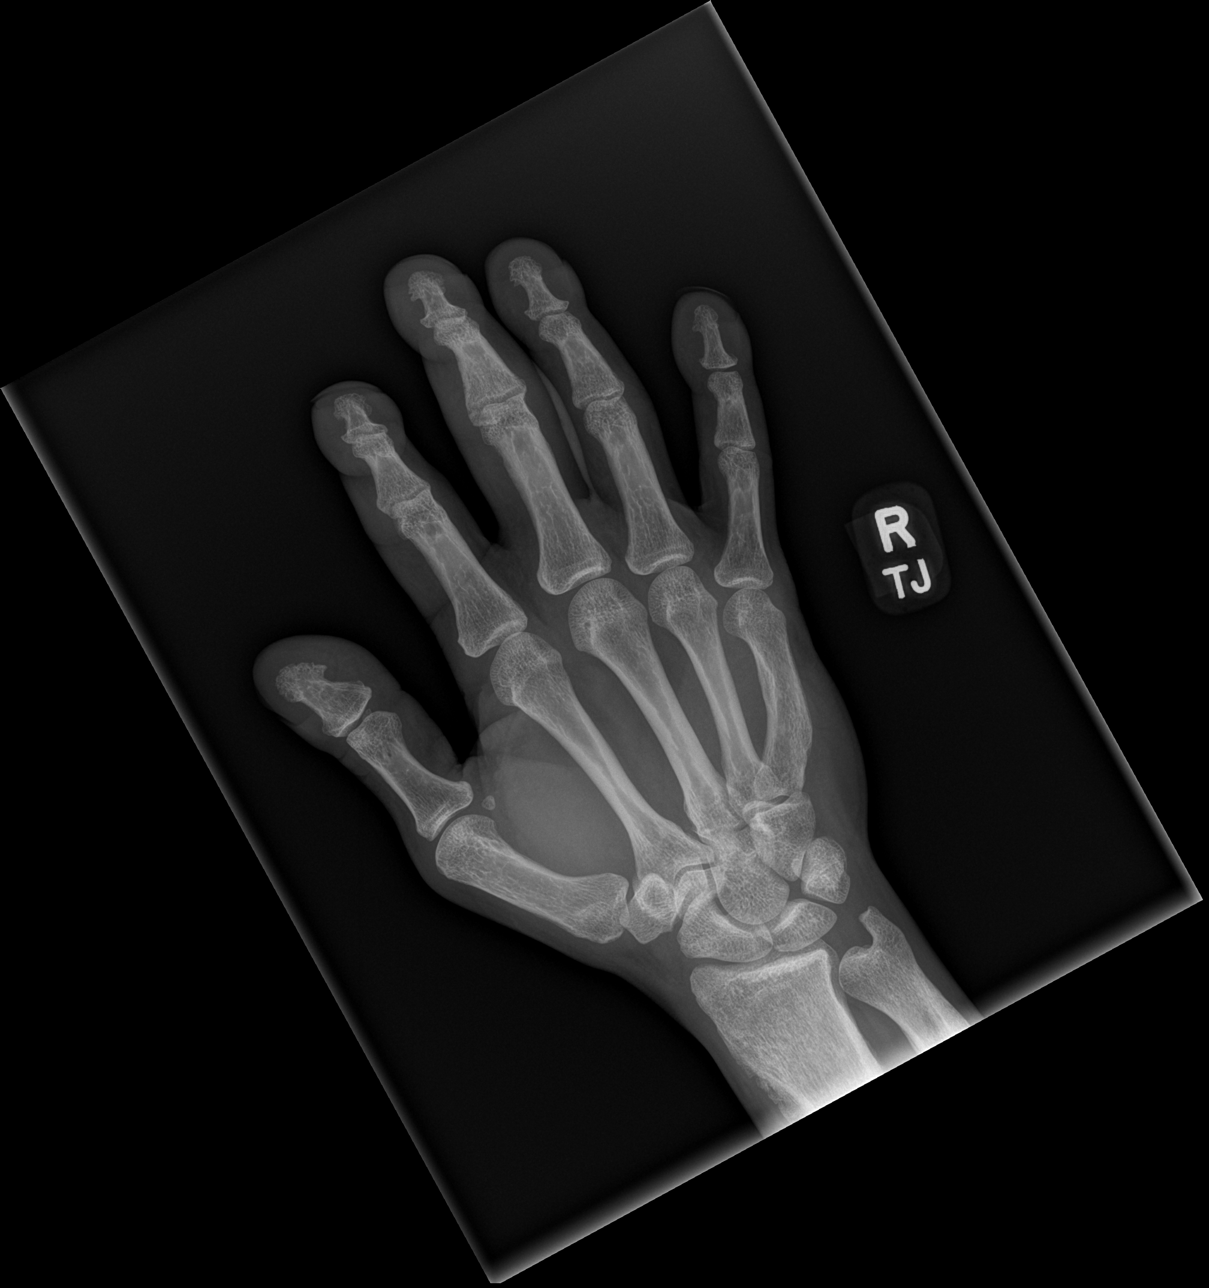

[x hand lat right]
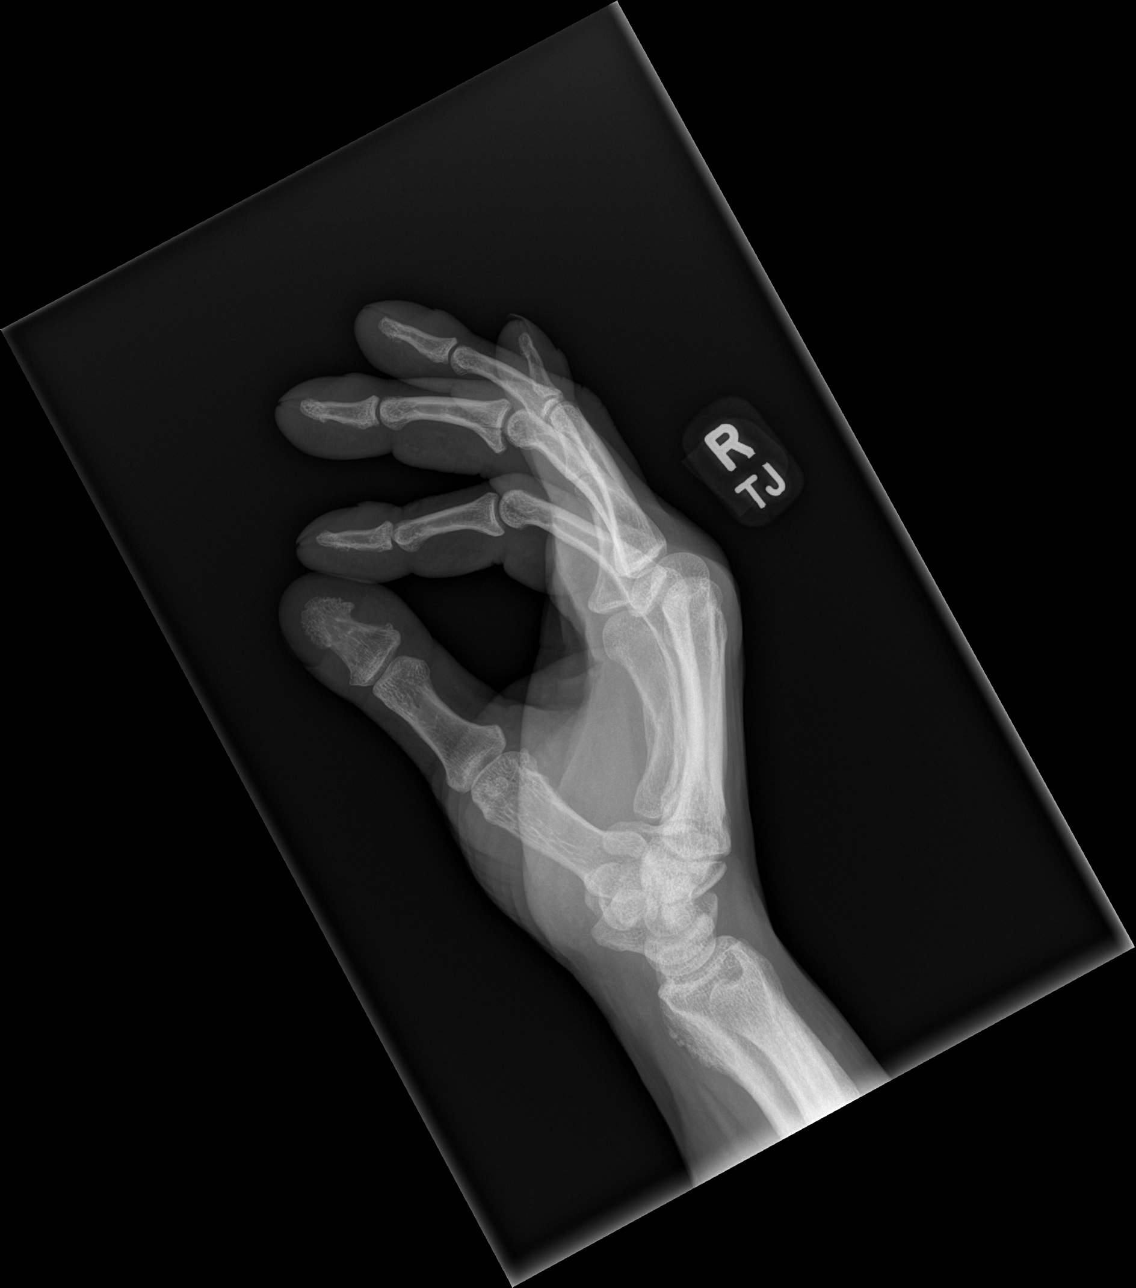

[3 of 3 positions shown; findings below may reference images not displayed]

FINDINGS: Mild distal third finger soft tissue swelling. On lateral view there
is a linear lucency at the mid AP dimension of the base of the
middle phalanx of third finger suspicious for a longitudinal
nondisplaced intra-articular fracture measuring up to approximately
5 mm in craniocaudal length. This is not well visualized on the
other views.

Mild DIP joint space narrowing diffusely. Old healed fracture of the
proximal shaft of fifth metacarpal. Mild developmental ulnar
negative variance. Minimal degenerative spurring at the thumb
interphalangeal joint.

Mild chronic appearing thickening of the distal radial cortex,
likely benign.
IMPRESSION: :
IMPRESSION: 1. Nondisplaced intra-articular fracture at the proximal aspect of
the middle phalanx of third finger extending along the longitudinal
dimension of the bone.
2. Mild osteoarthritis.

## 2023-09-19 ENCOUNTER — Ambulatory Visit

## 2023-12-01 ENCOUNTER — Other Ambulatory Visit (HOSPITAL_COMMUNITY): Payer: Self-pay

## 2024-02-21 ENCOUNTER — Other Ambulatory Visit (HOSPITAL_COMMUNITY): Payer: Self-pay

## 2024-02-21 ENCOUNTER — Ambulatory Visit: Payer: Self-pay | Admitting: Nurse Practitioner

## 2024-02-21 ENCOUNTER — Encounter: Payer: Self-pay | Admitting: Nurse Practitioner

## 2024-02-21 VITALS — BP 130/98 | HR 84 | Temp 99.1°F | Ht 67.0 in | Wt 172.6 lb

## 2024-02-21 DIAGNOSIS — R0683 Snoring: Secondary | ICD-10-CM | POA: Diagnosis not present

## 2024-02-21 DIAGNOSIS — I10 Essential (primary) hypertension: Secondary | ICD-10-CM | POA: Diagnosis not present

## 2024-02-21 DIAGNOSIS — E782 Mixed hyperlipidemia: Secondary | ICD-10-CM | POA: Diagnosis not present

## 2024-02-21 DIAGNOSIS — Z2821 Immunization not carried out because of patient refusal: Secondary | ICD-10-CM

## 2024-02-21 DIAGNOSIS — R801 Persistent proteinuria, unspecified: Secondary | ICD-10-CM | POA: Insufficient documentation

## 2024-02-21 DIAGNOSIS — R7309 Other abnormal glucose: Secondary | ICD-10-CM

## 2024-02-21 DIAGNOSIS — Z139 Encounter for screening, unspecified: Secondary | ICD-10-CM

## 2024-02-21 LAB — POCT URINALYSIS DIPSTICK
Blood, UA: NEGATIVE
Glucose, UA: NEGATIVE
Leukocytes, UA: NEGATIVE
Nitrite, UA: NEGATIVE
Protein, UA: POSITIVE — AB
Spec Grav, UA: 1.02 (ref 1.010–1.025)
Urobilinogen, UA: 1 U/dL
pH, UA: 7.5 (ref 5.0–8.0)

## 2024-02-21 MED ORDER — AMLODIPINE BESYLATE 5 MG PO TABS
5.0000 mg | ORAL_TABLET | Freq: Every day | ORAL | 1 refills | Status: AC
  Filled 2024-02-21 – 2024-03-14 (×3): qty 90, 90d supply, fill #0
  Filled 2024-07-12: qty 90, 90d supply, fill #1

## 2024-02-21 MED ORDER — ATORVASTATIN CALCIUM 10 MG PO TABS
10.0000 mg | ORAL_TABLET | ORAL | 1 refills | Status: AC
  Filled 2024-02-21: qty 36, 84d supply, fill #0
  Filled 2024-03-14: qty 38, 89d supply, fill #0
  Filled 2024-03-14: qty 36, 84d supply, fill #0
  Filled 2024-07-12: qty 36, 84d supply, fill #1

## 2024-02-21 NOTE — Assessment & Plan Note (Signed)
 Reports snoring and changes in sleep patterns. Discussed potential impact on blood pressure. - Order home sleep study through [facility]. - If home sleep study is positive, consider CPAP therapy. - If home sleep study is negative, consider referral to ENT for further evaluation of snoring.

## 2024-02-21 NOTE — Assessment & Plan Note (Signed)
 Blood pressure elevated due to inconsistent medication adherence and dietary habits. Discussed complications of uncontrolled hypertension. - Increase amlodipine  to 5 mg daily. Instruct to take two 2.5 mg tablets until current supply is exhausted. - Encourage home blood pressure monitoring. Confirm availability of a cuff at home in which his mother has one at home.  - Advise dietary modifications to reduce salt intake, including avoiding processed and frozen meals, sausages, and fried foods. - Ensure adequate hydration with at least 64 ounces of water daily.

## 2024-02-21 NOTE — Progress Notes (Signed)
 LILLETTE Kristeen JINNY Gladis, CMA,acting as a Neurosurgeon for Todd Ada, FNP.,have documented all relevant documentation on the behalf of Todd Ada, FNP,as directed by  Todd Ada, FNP while in the presence of Todd Ada, FNP.  Subjective:  Patient ID: Todd Montgomery , male    DOB: [birth year] , 43 y.o.   MRN: [id number]  Chief Complaint  Patient presents with   Hypertension    Patient presents today for a bp and chol follow up, Patient reports compliance with medication. Patient denies any chest pain, SOB, or headaches. Patient has no concerns today.      HPI  Discussed the use of AI scribe software for clinical note transcription with the patient, who gave verbal consent to proceed.  History of Present Illness Sherod Cisse is a 43 year old male with hypertension who presents for a follow-up visit.  He has been experiencing elevated blood pressure and occasionally forgets to take his medication in the morning due to rushing for work, but takes it upon returning home. He does not monitor his blood pressure at home, although he believes his mother has a cuff.  His dietary habits include consuming grilled foods such as hot dogs, hamburgers, and chicken over the weekend. He tries to avoid salty foods but acknowledges the potential for high salt intake from processed and frozen foods.  He reports snoring and has not undergone a sleep study. He mentions that he has recently started waking up early despite going to bed early, a change from a period when he was sleeping well. He used to work night shifts a long time ago but currently works day shifts.  He is on medication for high cholesterol, which was noted to be elevated in March. He reports occasionally missing doses of this medication. His A1c levels have been stable.   Past Medical History:  Diagnosis Date   Allergy      Family History  Problem Relation Age of Onset   Hypertension Mother    Stroke Mother    Healthy Sister    Hypertension  Maternal Grandmother      Current Outpatient Medications:    cyclobenzaprine  (FLEXERIL ) 10 MG tablet, Take 1 tablet (10 mg total) by mouth 3 (three) times daily as needed for muscle spasms., Disp: 30 tablet, Rfl: 0   amLODipine  (NORVASC ) 5 MG tablet, Take 1 tablet (5 mg total) by mouth daily., Disp: 90 tablet, Rfl: 1   atorvastatin  (LIPITOR) 10 MG tablet, Take 1 tablet (10 mg total) by mouth 3 (three) times a week on Monday, Wednesday and Fridays, Disp: 90 tablet, Rfl: 1   fluticasone  (FLONASE ) 50 MCG/ACT nasal spray, Place 2 sprays into both nostrils daily. (Patient not taking: Reported on 02/21/2024), Disp: 16 g, Rfl: 2   thiamine  (VITAMIN B-1) 100 MG tablet, Take 1 tablet (100 mg total) by mouth daily. (Patient not taking: Reported on 02/21/2024), Disp: 100 tablet, Rfl: 1   No Known Allergies   Review of Systems  Constitutional: Negative.   Respiratory: Negative.    Cardiovascular: Negative.   Gastrointestinal: Negative.  Negative for abdominal pain.  Musculoskeletal:  Negative for back pain.  Neurological: Negative.  Negative for weakness and numbness.     Today's Vitals   02/21/24 1429 02/21/24 1509  BP: (!) 140/100 (!) 130/98  Pulse: 84   Temp: 99.1 F (37.3 C)   TempSrc: Oral   Weight: 172 lb 9.6 oz (78.3 kg)   Height: 5' 7 (1.702 m)   PainSc: 0-No pain  Body mass index is 27.03 kg/m.  Wt Readings from Last 3 Encounters:  02/21/24 172 lb 9.6 oz (78.3 kg)  08/21/23 173 lb 9.6 oz (78.7 kg)  05/30/22 173 lb (78.5 kg)    Objective:  Physical Exam Vitals and nursing note reviewed.  Constitutional:      General: He is not in acute distress.    Appearance: Normal appearance.  Cardiovascular:     Rate and Rhythm: Normal rate and regular rhythm.     Pulses: Normal pulses.     Heart sounds: Normal heart sounds. No murmur heard. Pulmonary:     Effort: Pulmonary effort is normal. No respiratory distress.     Breath sounds: Normal breath sounds. No wheezing.  Skin:     General: Skin is warm and dry.     Capillary Refill: Capillary refill takes less than 2 seconds.  Neurological:     General: No focal deficit present.     Mental Status: He is alert and oriented to person, place, and time.     Cranial Nerves: No cranial nerve deficit.     Motor: No weakness.  Psychiatric:        Mood and Affect: Mood normal.        Behavior: Behavior normal.        Thought Content: Thought content normal.        Judgment: Judgment normal.         Assessment And Plan:  Essential hypertension Assessment & Plan: Blood pressure elevated due to inconsistent medication adherence and dietary habits. Discussed complications of uncontrolled hypertension. - Increase amlodipine  to 5 mg daily. Instruct to take two 2.5 mg tablets until current supply is exhausted. - Encourage home blood pressure monitoring. Confirm availability of a cuff at home in which his mother has one at home.  - Advise dietary modifications to reduce salt intake, including avoiding processed and frozen meals, sausages, and fried foods. - Ensure adequate hydration with at least 64 ounces of water daily.  Orders: -     BMP8+eGFR -     amLODIPine  Besylate; Take 1 tablet (5 mg total) by mouth daily.  Dispense: 90 tablet; Refill: 1  Mixed hyperlipidemia Assessment & Plan: Cholesterol levels elevated in March. Currently on atorvastatin  MWF - Recheck cholesterol levels today. - Advise dietary modifications to reduce intake of fried and processed foods.  Orders: -     Lipid panel -     Atorvastatin  Calcium ; Take 1 tablet (10 mg total) by mouth 3 (three) times a week on Monday, Wednesday and Fridays  Dispense: 90 tablet; Refill: 1  Abnormal glucose Assessment & Plan: Hgba1c is stable, continue focusing on healthy diet and regular exercise.    COVID-19 vaccination declined  Encounter for screening -     Hepatitis B surface antibody,qualitative  Snoring Assessment & Plan: Reports snoring and changes  in sleep patterns. Discussed potential impact on blood pressure. - Order home sleep study through [facility]. - If home sleep study is positive, consider CPAP therapy. - If home sleep study is negative, consider referral to ENT for further evaluation of snoring.   Persistent proteinuria Assessment & Plan: Protein in urine is better will order a SPEP with labs in am.   Orders: -     POCT urinalysis dipstick      Return for keep same next.  Patient was given opportunity to ask questions. Patient verbalized understanding of the plan and was able to repeat key elements of the plan. All  questions were answered to their satisfaction.    LILLETTE Todd Ada, FNP, have reviewed all documentation for this visit. The documentation on 02/21/24 for the exam, diagnosis, procedures, and orders are all accurate and complete.   IF YOU HAVE BEEN REFERRED TO A SPECIALIST, IT MAY TAKE 1-2 WEEKS TO SCHEDULE/PROCESS THE REFERRAL. IF YOU HAVE NOT HEARD FROM US /SPECIALIST IN TWO WEEKS, PLEASE GIVE US  A CALL AT [phone].

## 2024-02-21 NOTE — Assessment & Plan Note (Signed)
 Cholesterol levels elevated in March. Currently on atorvastatin  MWF - Recheck cholesterol levels today. - Advise dietary modifications to reduce intake of fried and processed foods.

## 2024-02-21 NOTE — Assessment & Plan Note (Signed)
 Protein in urine is better will order a SPEP with labs in am.

## 2024-02-21 NOTE — Assessment & Plan Note (Signed)
 Hgba1c is stable, continue focusing on healthy diet and regular exercise

## 2024-02-22 LAB — LIPID PANEL
Chol/HDL Ratio: 2.7 ratio (ref 0.0–5.0)
Cholesterol, Total: 231 mg/dL — ABNORMAL HIGH (ref 100–199)
HDL: 86 mg/dL (ref >39)
LDL Chol Calc (NIH): 127 mg/dL — ABNORMAL HIGH (ref 0–99)
Triglycerides: 106 mg/dL (ref 0–149)
VLDL Cholesterol Cal: 18 mg/dL (ref 5–40)

## 2024-02-22 LAB — BMP8+EGFR
BUN/Creatinine Ratio: 8 — ABNORMAL LOW (ref 9–20)
BUN: 9 mg/dL (ref 6–24)
CO2: 26 mmol/L (ref 20–29)
Calcium: 10.1 mg/dL (ref 8.7–10.2)
Chloride: 99 mmol/L (ref 96–106)
Creatinine, Ser: 1.13 mg/dL (ref 0.76–1.27)
Glucose: 80 mg/dL (ref 70–99)
Potassium: 4.5 mmol/L (ref 3.5–5.2)
Sodium: 141 mmol/L (ref 134–144)
eGFR: 82 mL/min/1.73 (ref >59)

## 2024-02-22 LAB — HEPATITIS B SURFACE ANTIBODY,QUALITATIVE: Hep B Surface Ab, Qual: NONREACTIVE

## 2024-03-01 ENCOUNTER — Other Ambulatory Visit (HOSPITAL_COMMUNITY): Payer: Self-pay

## 2024-03-14 ENCOUNTER — Other Ambulatory Visit (HOSPITAL_COMMUNITY): Payer: Self-pay

## 2024-03-14 ENCOUNTER — Other Ambulatory Visit (HOSPITAL_BASED_OUTPATIENT_CLINIC_OR_DEPARTMENT_OTHER): Payer: Self-pay

## 2024-03-15 ENCOUNTER — Other Ambulatory Visit (HOSPITAL_BASED_OUTPATIENT_CLINIC_OR_DEPARTMENT_OTHER): Payer: Self-pay

## 2024-07-12 ENCOUNTER — Other Ambulatory Visit (HOSPITAL_COMMUNITY): Payer: Self-pay

## 2024-08-22 ENCOUNTER — Encounter: Payer: Self-pay | Admitting: Nurse Practitioner
